# Patient Record
Sex: Female | Born: 1975 | Race: Black or African American | Hispanic: No | Marital: Married | Smoking: Current every day smoker
Health system: Southern US, Community
[De-identification: ages and names within clinical notes are randomized; demographics above are authoritative.]

## PROBLEM LIST (undated history)

## (undated) DIAGNOSIS — F419 Anxiety disorder, unspecified: Secondary | ICD-10-CM

## (undated) DIAGNOSIS — F410 Panic disorder [episodic paroxysmal anxiety] without agoraphobia: Secondary | ICD-10-CM

## (undated) DIAGNOSIS — A6009 Herpesviral infection of other urogenital tract: Secondary | ICD-10-CM

## (undated) DIAGNOSIS — K219 Gastro-esophageal reflux disease without esophagitis: Secondary | ICD-10-CM

## (undated) DIAGNOSIS — K589 Irritable bowel syndrome without diarrhea: Secondary | ICD-10-CM

## (undated) HISTORY — DX: Herpesviral infection of other urogenital tract: A60.09

## (undated) HISTORY — PX: COLONOSCOPY: SHX174

## (undated) HISTORY — DX: Gastro-esophageal reflux disease without esophagitis: K21.9

## (undated) HISTORY — DX: Irritable bowel syndrome, unspecified: K58.9

## (undated) HISTORY — PX: ADENOIDECTOMY: SUR15

---

## 1999-03-05 ENCOUNTER — Other Ambulatory Visit: Admission: RE | Admit: 1999-03-05 | Discharge: 1999-03-05 | Payer: Self-pay | Admitting: Family Medicine

## 2003-03-30 ENCOUNTER — Emergency Department (HOSPITAL_COMMUNITY): Admission: EM | Admit: 2003-03-30 | Discharge: 2003-03-30 | Payer: Self-pay | Admitting: Internal Medicine

## 2013-04-13 ENCOUNTER — Encounter (INDEPENDENT_AMBULATORY_CARE_PROVIDER_SITE_OTHER): Payer: Self-pay

## 2013-04-13 ENCOUNTER — Ambulatory Visit (INDEPENDENT_AMBULATORY_CARE_PROVIDER_SITE_OTHER): Payer: Medicaid Other | Admitting: *Deleted

## 2013-04-13 DIAGNOSIS — Z23 Encounter for immunization: Secondary | ICD-10-CM

## 2013-07-20 ENCOUNTER — Ambulatory Visit (INDEPENDENT_AMBULATORY_CARE_PROVIDER_SITE_OTHER): Payer: Medicaid Other | Admitting: Family Medicine

## 2013-07-20 VITALS — BP 170/101 | HR 89 | Temp 99.6°F | Wt 173.0 lb

## 2013-07-20 DIAGNOSIS — L0390 Cellulitis, unspecified: Secondary | ICD-10-CM

## 2013-07-20 DIAGNOSIS — L0291 Cutaneous abscess, unspecified: Secondary | ICD-10-CM

## 2013-07-20 MED ORDER — DOXYCYCLINE HYCLATE 100 MG PO TABS: 100.0000 mg | ORAL_TABLET | Freq: Two times a day (BID) | ORAL | Status: DC

## 2013-07-20 MED ORDER — HYDROCODONE-ACETAMINOPHEN 5-325 MG PO TABS: 1.0000 | ORAL_TABLET | Freq: Four times a day (QID) | ORAL | Status: DC | PRN

## 2013-07-20 MED ORDER — CEFTRIAXONE SODIUM 1 G IJ SOLR
1.0000 g | INTRAMUSCULAR | Status: AC
  Administered 2013-07-20: 1 g via INTRAMUSCULAR

## 2013-07-20 NOTE — Progress Notes (Signed)
   Subjective:    Patient ID: Emily Grant, female    DOB: [birth year], 38 y.o.   MRN: [id number]  HPI  This 38 y.o. female presents for evaluation of boil on right axilla.  She really wants to avoid an I&D Procedure because of the pain.  Review of Systems C/o boil right axilla   No chest pain, SOB, HA, dizziness, vision change, N/V, diarrhea, constipation, dysuria, urinary urgency or frequency, myalgias, arthralgias or rash.  Objective:   Physical Exam Vital signs noted  Well developed well nourished female.  HEENT - Head atraumatic Normocephalic  Respiratory - Lungs CTA bilateral Cardiac - RRR S1 and S2 without murmur GI - Abdomen soft Nontender and bowel sounds active x 4 Skin - Right axillary abscess with fluctuance and tenderness      Assessment & Plan:  Abscess - Plan: doxycycline (VIBRA-TABS) 100 MG tablet, cefTRIAXone (ROCEPHIN) injection 1 g Discussed using warm compresses and norco 5mg  one po qid prn pain #15, and then Follow up next week for I&D.  Deatra CanterWilliam J Raynell Upton FNP

## 2013-07-20 NOTE — Patient Instructions (Addendum)
Abscess An abscess is an infected area that contains a collection of pus and debris.It can occur in almost any part of the body. An abscess is also known as a furuncle or boil. CAUSES  An abscess occurs when tissue gets infected. This can occur from blockage of oil or sweat glands, infection of hair follicles, or a minor injury to the skin. As the body tries to fight the infection, pus collects in the area and creates pressure under the skin. This pressure causes pain. People with weakened immune systems have difficulty fighting infections and get certain abscesses more often.  SYMPTOMS Usually an abscess develops on the skin and becomes a painful mass that is red, warm, and tender. If the abscess forms under the skin, you may feel a moveable soft area under the skin. Some abscesses break open (rupture) on their own, but most will continue to get worse without care. The infection can spread deeper into the body and eventually into the bloodstream, causing you to feel ill.  DIAGNOSIS  Your caregiver will take your medical history and perform a physical exam. A sample of fluid may also be taken from the abscess to determine what is causing your infection. TREATMENT  Your caregiver may prescribe antibiotic medicines to fight the infection. However, taking antibiotics alone usually does not cure an abscess. Your caregiver may need to make a small cut (incision) in the abscess to drain the pus. In some cases, gauze is packed into the abscess to reduce pain and to continue draining the area. HOME CARE INSTRUCTIONS   Only take over-the-counter or prescription medicines for pain, discomfort, or fever as directed by your caregiver.  If you were prescribed antibiotics, take them as directed. Finish them even if you start to feel better.  If gauze is used, follow your caregiver's directions for changing the gauze.  To avoid spreading the infection:  Keep your draining abscess covered with a  bandage.  Wash your hands well.  Do not share personal care items, towels, or whirlpools with others.  Avoid skin contact with others.  Keep your skin and clothes clean around the abscess.  Keep all follow-up appointments as directed by your caregiver. SEEK MEDICAL CARE IF:   You have increased pain, swelling, redness, fluid drainage, or bleeding.  You have muscle aches, chills, or a general ill feeling.  You have a fever. MAKE SURE YOU:   Understand these instructions.  Will watch your condition.  Will get help right away if you are not doing well or get worse. Document Released: 03/17/2005 Document Revised: 12/07/2011 Document Reviewed: 08/20/2011 [facility] Patient Information 2014 [place], [place].   Ceftriaxone injection What is this medicine? CEFTRIAXONE (sef try AX one) is a cephalosporin antibiotic. It is used to treat certain kinds of bacterial infections. It will not work for colds, flu, or other viral infections. This medicine may be used for other purposes; ask your health care provider or pharmacist if you have questions. COMMON BRAND NAME(S): Rocephin What should I tell my health care provider before I take this medicine? They need to know if you have any of these conditions: -any chronic illness -bowel disease, like colitis -both kidney and liver disease -high bilirubin level in newborn patients -an unusual or allergic reaction to ceftriaxone, other cephalosporin or penicillin antibiotics, foods, dyes or preservatives -pregnant or trying to get pregnant -breast-feeding How should I use this medicine? This medicine is injected into a muscle or infused it into a vein. It is usually given  in a medical office or clinic. If you are to give this medicine you will be taught how to inject it. Follow instructions carefully. Use your doses at regular intervals. Do not take your medicine more often than directed. Do not skip doses or stop your medicine early even if you  feel better. Do not stop taking except on your doctor's advice. Talk to your pediatrician regarding the use of this medicine in children. Special care may be needed. Overdosage: If you think you have taken too much of this medicine contact a poison control center or emergency room at once. NOTE: This medicine is only for you. Do not share this medicine with others. What if I miss a dose? If you miss a dose, take it as soon as you can. If it is almost time for your next dose, take only that dose. Do not take double or extra doses. What may interact with this medicine? Do not take this medicine with any of the following medications: -intravenous calcium This list may not describe all possible interactions. Give your health care provider a list of all the medicines, herbs, non-prescription drugs, or dietary supplements you use. Also tell them if you smoke, drink alcohol, or use illegal drugs. Some items may interact with your medicine. What should I watch for while using this medicine? Tell your doctor or health care professional if your symptoms do not improve or if they get worse. Do not treat diarrhea with over the counter products. Contact your doctor if you have diarrhea that lasts more than 2 days or if it is severe and watery. If you are being treated for a sexually transmitted disease, avoid sexual contact until you have finished your treatment. Having sex can infect your sexual partner. Calcium may bind to this medicine and cause lung or kidney problems. Avoid calcium products while taking this medicine and for 48 hours after taking the last dose of this medicine. What side effects may I notice from receiving this medicine? Side effects that you should report to your doctor or health care professional as soon as possible: -allergic reactions like skin rash, itching or hives, swelling of the face, lips, or tongue -breathing problems -fever, chills -irregular heartbeat -pain when passing  urine -seizures -stomach pain, cramps -unusual bleeding, bruising -unusually weak or tired Side effects that usually do not require medical attention (report to your doctor or health care professional if they continue or are bothersome): -diarrhea -dizzy, drowsy -headache -nausea, vomiting -pain, swelling, irritation where injected -stomach upset -sweating This list may not describe all possible side effects. Call your doctor for medical advice about side effects. You may report side effects to FDA at 1-800-FDA-1088. Where should I keep my medicine? Keep out of the reach of children. Store at room temperature below 25 degrees C (77 degrees F). Protect from light. Throw away any unused vials after the expiration date. NOTE: This sheet is a summary. It may not cover all possible information. If you have questions about this medicine, talk to your doctor, pharmacist, or health care provider.  2014, Elsevier/[employer] Standard. (2012-06-19 15:34:57)

## 2013-12-04 ENCOUNTER — Encounter (INDEPENDENT_AMBULATORY_CARE_PROVIDER_SITE_OTHER): Payer: Medicaid Other

## 2014-03-18 ENCOUNTER — Telehealth: Payer: Self-pay | Admitting: Nurse Practitioner

## 2014-03-18 NOTE — Telephone Encounter (Signed)
appt scheduled

## 2014-03-26 ENCOUNTER — Ambulatory Visit (INDEPENDENT_AMBULATORY_CARE_PROVIDER_SITE_OTHER): Payer: Medicaid Other

## 2014-03-26 DIAGNOSIS — Z23 Encounter for immunization: Secondary | ICD-10-CM

## 2015-03-28 ENCOUNTER — Ambulatory Visit (INDEPENDENT_AMBULATORY_CARE_PROVIDER_SITE_OTHER): Payer: Medicaid Other | Admitting: Family Medicine

## 2015-03-28 ENCOUNTER — Ambulatory Visit (INDEPENDENT_AMBULATORY_CARE_PROVIDER_SITE_OTHER): Payer: Medicaid Other

## 2015-03-28 ENCOUNTER — Encounter: Payer: Self-pay | Admitting: Family Medicine

## 2015-03-28 VITALS — BP 125/75 | HR 91 | Temp 98.2°F | Ht 62.5 in | Wt 175.6 lb

## 2015-03-28 DIAGNOSIS — J029 Acute pharyngitis, unspecified: Secondary | ICD-10-CM | POA: Insufficient documentation

## 2015-03-28 DIAGNOSIS — H02829 Cysts of unspecified eye, unspecified eyelid: Secondary | ICD-10-CM | POA: Insufficient documentation

## 2015-03-28 DIAGNOSIS — H02823 Cysts of right eye, unspecified eyelid: Secondary | ICD-10-CM | POA: Diagnosis not present

## 2015-03-28 DIAGNOSIS — M25561 Pain in right knee: Secondary | ICD-10-CM

## 2015-03-28 LAB — POCT RAPID STREP A (OFFICE): Rapid Strep A Screen: NEGATIVE

## 2015-03-28 MED ORDER — AZITHROMYCIN 250 MG PO TABS: ORAL_TABLET | ORAL | Status: DC

## 2015-03-28 MED ORDER — NAPROXEN 500 MG PO TABS: 500.0000 mg | ORAL_TABLET | Freq: Two times a day (BID) | ORAL | Status: DC

## 2015-03-28 NOTE — Patient Instructions (Signed)
Nice to meet you!  For your knee- Try compression (knee sleeve), naprosyn twice daily for 5 days, and ice 2-3 times daily for 10-15 minutes each time  I am treating you for a lung infection with azithromycin  You will get a call about an eye doctor.

## 2015-03-28 NOTE — Progress Notes (Addendum)
HPI  Patient presents today for same-day visit for sore throat, ear pain, eyelid nodule, and knee pain.  Knee pain She states that she was dancing on September 23 with her husband when her right knee got tangled with his and she had a lateral deviation with medial pain of her right knee. She states that it's been slightly swollen since that time and difficult to straighten out. She states that hurts worse with walking. She has popping symptoms frequently.  Sore throat She's had sore throat, right ear pain, and congestion for 2 days. 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 There is no vision changes She has no fevers, chills, sweats.   PMH: Smoking status noted ROS: Per HPI  Objective: BP 125/75 mmHg  Pulse 91  Temp(Src) 98.2 F (36.8 C) (Oral)  Ht 5' 2.5" (1.588 m)  Wt 175 lb 9.6 oz (79.652 kg)  BMI 31.59 kg/m2 Gen: NAD, alert, cooperative with exam HEENT: NCAT, right eye with a small approximately 0.7 cm in diameter spherical mobile nodule that is nontender to palpation CV: RRR, good S1/S2, no murmur Resp: Nonlabored, coarse breath sounds with some scattered wheezing in left and right midlung fields Ext: No edema, warm Neuro: Alert and oriented, No gross deficits MSK: R knee without erythema, bruising, or gross deformity Some mild swelling of the right knee, patient has difficulty straightening it No joint line tenderness.  ligamentously intact to Lachman's and with varus and valgus stress.  Negative McMurray's test Walks with a limp   DG right knee: Medial compartment joint space narrowing, no bony abnormalities otherwise  Assessment and plan:  # Right knee pain Likely meniscal tear Offered injection and draining of fluid today, she declines  for now Conservative therapy with NSAIDs, compression, ice Plain film without any bony abnormality except for medial compartment arthritis   # Sore throat, acute illness Given her lung exam I am going to go ahead and treat for bronchitis, this could be due to her smoking, however she has coarse breath sounds in am concerned she has developing pneumonia. Azithromycin Strep negative  # Eyelid nodule Unclear etiology, will send to ophthalmology for her the recommendations No signs of inflammation or acute concern   Orders Placed This Encounter  Procedures  . Culture, Group A Strep  . DG Knee 1-2 Views Right    Standing Status: Future     Number of Occurrences:      Standing Expiration Date: 05/27/2016    Order Specific Question:  Reason for Exam (SYMPTOM  OR DIAGNOSIS REQUIRED)    Answer:  knee pain    Order Specific Question:  Is the patient pregnant?    Answer:  No    Order Specific Question:  Preferred imaging location?    Answer:  Internal  . Ambulatory referral to Ophthalmology    Referral Priority:  Routine    Referral Type:  Consultation    Referral Reason:  Specialty Services Required    Requested Specialty:  Ophthalmology    Number of Visits Requested:  1  . POCT rapid strep A    Meds ordered this encounter  Medications  . valACYclovir (VALTREX) 1000 MG tablet    Sig: Take 1,000 mg by mouth 2 (two) times daily.  . naproxen (NAPROSYN) 500 MG  tablet    Sig: Take 1 tablet (500 mg total) by mouth 2 (two) times daily with a meal.    Dispense:  30 tablet    Refill:  1  . azithromycin (ZITHROMAX) 250 MG tablet    Sig: Take 2 tablets on day 1 and 1 tablet daily after that    Dispense:  6 tablet    Refill:  0    Murtis Sink, MD [facility] 03/28/2015, 12:24 PM

## 2015-04-01 LAB — CULTURE, GROUP A STREP

## 2015-04-02 ENCOUNTER — Telehealth: Payer: Self-pay | Admitting: Family Medicine

## 2015-04-02 NOTE — Telephone Encounter (Signed)
Stp and reviewed results. Pt voiced understanding.

## 2015-04-30 ENCOUNTER — Ambulatory Visit (INDEPENDENT_AMBULATORY_CARE_PROVIDER_SITE_OTHER): Payer: Medicaid Other | Admitting: Family Medicine

## 2015-04-30 ENCOUNTER — Encounter: Payer: Self-pay | Admitting: Family Medicine

## 2015-04-30 VITALS — BP 125/78 | HR 93 | Temp 97.3°F | Ht 62.5 in | Wt 178.0 lb

## 2015-04-30 DIAGNOSIS — M25561 Pain in right knee: Secondary | ICD-10-CM

## 2015-04-30 DIAGNOSIS — Z23 Encounter for immunization: Secondary | ICD-10-CM

## 2015-04-30 NOTE — Patient Instructions (Signed)
Great to see you!  We will work on the appointment  Continue wearing the knee sleeve, especially while you are doing lots of walking or exercise Ice afterward Naproxen twice daily or advil 3-4 times daily as needed

## 2015-04-30 NOTE — Progress Notes (Signed)
   HPI  Patient presents today for recheck of right knee pain.  Patient explains that she has continued right knee pain. Improved since her last visit but has continued on the medial part of the right knee. It's worse at the end of the day after loss of walking or exercise. She has mild swelling intermittently. She is very anxious about the possibility of a knee injection.  She reports a limp but states the pain only bothers her 3-4 days a week  PMH: Smoking status noted ROS: Per HPI  Objective: BP 125/78 mmHg  Pulse 93  Temp(Src) 97.3 F (36.3 C) (Oral)  Ht 5' 2.5" (1.588 m)  Wt 178 lb (80.74 kg)  BMI 32.02 kg/m2 Gen: NAD, alert, cooperative with exam HEENT: NCAT, Healing scar above R eye lid Ext: No edema, warm Neuro: Alert and oriented, No gross deficits MSK: R knee with mild effusion, no erythema, bruising, or gross deformity Medial joint line tenderness ligamentously intact to Lachman's and with varus and valgus stress.  McMurray's test with medial jont line tenderness, also with valgus stress   Assessment and plan:  # R knee pain, likely meniscal injury Offered injectiuon, she is too nervous Recommended continue compression, ice nsaids Refer to Ortho for consideration of injection and possible R knee arthroscopy   R eyelid cyst removed by optho, doing well.   Orders Placed This Encounter  Procedures  . Ambulatory referral to Orthopedic Surgery    Referral Priority:  Routine    Referral Type:  Surgical    Referral Reason:  Specialty Services Required    Requested Specialty:  Orthopedic Surgery    Number of Visits Requested:  1    No orders of the defined types were placed in this encounter.    Murtis SinkSam Moosa Bueche, MD [facility] 04/30/2015, 11:29 AM

## 2016-01-09 ENCOUNTER — Ambulatory Visit (INDEPENDENT_AMBULATORY_CARE_PROVIDER_SITE_OTHER): Payer: Medicaid Other | Admitting: Nurse Practitioner

## 2016-01-09 ENCOUNTER — Telehealth: Payer: Self-pay | Admitting: Family Medicine

## 2016-01-09 ENCOUNTER — Encounter: Payer: Self-pay | Admitting: Nurse Practitioner

## 2016-01-09 VITALS — BP 129/84 | HR 99 | Temp 97.6°F | Ht 62.0 in | Wt 169.0 lb

## 2016-01-09 DIAGNOSIS — B009 Herpesviral infection, unspecified: Secondary | ICD-10-CM

## 2016-01-09 DIAGNOSIS — Z111 Encounter for screening for respiratory tuberculosis: Secondary | ICD-10-CM | POA: Diagnosis not present

## 2016-01-09 MED ORDER — VALACYCLOVIR HCL 1 G PO TABS: 1000.0000 mg | ORAL_TABLET | Freq: Two times a day (BID) | ORAL | Status: DC

## 2016-01-09 NOTE — Progress Notes (Signed)
   Subjective:    Patient ID: Emily Grant, female    DOB: 06-20-1976, 40 y.o.   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 Currently does not have an outbreak.    Review of Systems  Constitutional: Negative.   HENT: Negative.   Respiratory: Negative.   Cardiovascular: Negative.   Genitourinary: Negative.   Neurological: Negative.   Psychiatric/Behavioral: Negative.   All other systems reviewed and are negative.      Objective:   Physical Exam  Constitutional: She is oriented to person, place, and time. She appears well-developed and well-nourished.  Cardiovascular: Normal rate, regular rhythm and normal heart sounds.   Pulmonary/Chest: Effort normal and breath sounds normal.  Neurological: She is alert and oriented to person, place, and time.  Skin: Skin is warm.  Psychiatric: She has a normal mood and affect. Her behavior is normal. Judgment and thought content normal.   BP 129/84 mmHg  Pulse 99  Temp(Src) 97.6 F (36.4 C) (Oral)  Ht 5\' 2"  (1.575 m)  Wt 169 lb (76.658 kg)  BMI 30.90 kg/m2        Assessment & Plan:   1. Encounter for PPD test   2. HSV-2 infection    Meds ordered this encounter  Medications  . valACYclovir (VALTREX) 1000 MG tablet    Sig: Take 1 tablet (1,000 mg total) by mouth 2 (two) times daily.    Dispense:  30 tablet    Refill:  1    Order Specific Question:  Supervising Provider    Answer:  Johna SheriffVINCENT, CAROL L [[id number]]   Safe sex reviewed Follow up prn  Mary-Margaret Daphine DeutscherMartin, FNP

## 2016-01-09 NOTE — Telephone Encounter (Signed)
Pt given appt with MMM today at 4:30. 

## 2016-01-12 LAB — TB SKIN TEST
INDURATION: 0 mm
TB Skin Test: NEGATIVE

## 2016-01-27 ENCOUNTER — Telehealth: Payer: Self-pay | Admitting: Nurse Practitioner

## 2016-01-27 DIAGNOSIS — K5900 Constipation, unspecified: Secondary | ICD-10-CM

## 2016-01-27 MED ORDER — LUBIPROSTONE 8 MCG PO CAPS: 8.0000 ug | ORAL_CAPSULE | Freq: Two times a day (BID) | ORAL | 2 refills | Status: DC

## 2016-01-27 NOTE — Telephone Encounter (Signed)
Pt notified RX was sent into the pharmacy

## 2016-01-27 NOTE — Telephone Encounter (Signed)
Constipation is worse Miralax is not helping Pt wants rx Please advise

## 2016-01-27 NOTE — Telephone Encounter (Signed)
Will send in amitiza rx

## 2016-02-03 ENCOUNTER — Encounter: Payer: Self-pay | Admitting: Family Medicine

## 2016-02-03 ENCOUNTER — Other Ambulatory Visit: Payer: Self-pay | Admitting: Family Medicine

## 2016-02-03 ENCOUNTER — Ambulatory Visit (INDEPENDENT_AMBULATORY_CARE_PROVIDER_SITE_OTHER): Payer: Medicaid Other

## 2016-02-03 ENCOUNTER — Ambulatory Visit: Payer: Medicaid Other | Admitting: Family Medicine

## 2016-02-03 VITALS — BP 127/72 | HR 79 | Temp 98.1°F | Ht 62.0 in | Wt 168.2 lb

## 2016-02-03 DIAGNOSIS — M546 Pain in thoracic spine: Secondary | ICD-10-CM

## 2016-02-03 DIAGNOSIS — M542 Cervicalgia: Secondary | ICD-10-CM | POA: Diagnosis not present

## 2016-02-03 MED ORDER — METHOCARBAMOL 500 MG PO TABS: 500.0000 mg | ORAL_TABLET | Freq: Four times a day (QID) | ORAL | 1 refills | Status: DC

## 2016-02-03 MED ORDER — PREDNISONE 50 MG PO TABS: 50.0000 mg | ORAL_TABLET | Freq: Every day | ORAL | 0 refills | Status: DC

## 2016-02-03 NOTE — Progress Notes (Signed)
   HPI  Patient presents today here in 5 days of acute thoracic back pain.  Patient's lines that she just woke up one morning and had bilateral midline thoracic back pain between her shoulder blades. She states that over the last 2 days as started to bother her neck as well. She has no arm weakness or numbness 13 living. She has difficulty lifting her arms over midline due to pain.  She states Flexeril is not helping, also a muscle relaxer from a friend is not helping. She is unaware of what medication she tried from her friend. She does smoke marijuana but denies any history of injected drugs.  As fever, chills, sweats, or feeling ill.  PMH: Smoking status noted ROS: Per HPI  Objective: BP 127/72   Pulse 79   Temp 98.1 F (36.7 C) (Oral)   Ht 5\' 2"  (1.575 m)   Wt 168 lb 4 oz (76.3 kg)   LMP 01/16/2016 (Exact Date)   BMI 30.77 kg/m  Gen: NAD, alert, cooperative with exam HEENT: NCAT, EOMI CV: RRR, good S1/S2, no murmur Resp: CTABL, no wheezes, non-labored Ext: No edema, warm Neuro: Alert and oriented, No gross deficits  Skill skeletal Tenderness to palpation of thoracic and cervical spine, as well as bilateral paraspinal muscles in the thoracic and cervical area. Range of motion of neck is very limited  Assessment and plan:  # Neck and thoracic back pain. Likely muscle spasm. No signs or symptoms of disc infection or serious disc disease. Plain films today are normal. Maximize anti-inflammatory using prednisone 7 days, change muscle relaxer to Robaxin. 2 days out of work Return to clinic if not improved or if she is getting worse.      Orders Placed This Encounter  Procedures  . DG Thoracic Spine 2 View    Standing Status:   Future    Standing Expiration Date:   04/04/2017    Order Specific Question:   Reason for Exam (SYMPTOM  OR DIAGNOSIS REQUIRED)    Answer:   neck and back pain    Order Specific Question:   Is the patient pregnant?    Answer:   No   Order Specific Question:   Preferred imaging location?    Answer:   Internal  . DG Cervical Spine 2 or 3 views    Standing Status:   Future    Standing Expiration Date:   02/02/2017    Order Specific Question:   Reason for Exam (SYMPTOM  OR DIAGNOSIS REQUIRED)    Answer:   neck and back pain    Order Specific Question:   Is the patient pregnant?    Answer:   No    Order Specific Question:   Preferred imaging location?    Answer:   Internal    Meds ordered this encounter  Medications  . predniSONE (DELTASONE) 50 MG tablet    Sig: Take 1 tablet (50 mg total) by mouth daily with breakfast.    Dispense:  7 tablet    Refill:  0  . methocarbamol (ROBAXIN) 500 MG tablet    Sig: Take 1 tablet (500 mg total) by mouth 4 (four) times daily.    Dispense:  30 tablet    Refill:  1    Murtis SinkSam Jahad Old, MD [facility] 02/03/2016, 11:27 AM

## 2016-02-03 NOTE — Patient Instructions (Signed)
Great to meet you!  Try the prednisone 1 pill once a day, also try robaxin.    Back Pain, Adult Back pain is very common in adults.The cause of back pain is rarely dangerous and the pain often gets better over time.The cause of your back pain may not be known. Some common causes of back pain include:  Strain of the muscles or ligaments supporting the spine.  Wear and tear (degeneration) of the spinal disks.  Arthritis.  Direct injury to the back. For many people, back pain may return. Since back pain is rarely dangerous, most people can learn to manage this condition on their own. HOME CARE INSTRUCTIONS Watch your back pain for any changes. The following actions may help to lessen any discomfort you are feeling:  Remain active. It is stressful on your back to sit or stand in one place for long periods of time. Do not sit, drive, or stand in one place for more than 30 minutes at a time. Take short walks on even surfaces as soon as you are able.Try to increase the length of time you walk each day.  Exercise regularly as directed by your health care provider. Exercise helps your back heal faster. It also helps avoid future injury by keeping your muscles strong and flexible.  Do not stay in bed.Resting more than 1-2 days can delay your recovery.  Pay attention to your body when you bend and lift. The most comfortable positions are those that put less stress on your recovering back. Always use proper lifting techniques, including:  Bending your knees.  Keeping the load close to your body.  Avoiding twisting.  Find a comfortable position to sleep. Use a firm mattress and lie on your side with your knees slightly bent. If you lie on your back, put a pillow under your knees.  Avoid feeling anxious or stressed.Stress increases muscle tension and can worsen back pain.It is important to recognize when you are anxious or stressed and learn ways to manage it, such as with  exercise.  Take medicines only as directed by your health care provider. Over-the-counter medicines to reduce pain and inflammation are often the most helpful.Your health care provider may prescribe muscle relaxant drugs.These medicines help dull your pain so you can more quickly return to your normal activities and healthy exercise.  Apply ice to the injured area:  Put ice in a plastic bag.  Place a towel between your skin and the bag.  Leave the ice on for 20 minutes, 2-3 times a day for the first 2-3 days. After that, ice and heat may be alternated to reduce pain and spasms.  Maintain a healthy weight. Excess weight puts extra stress on your back and makes it difficult to maintain good posture. SEEK MEDICAL CARE IF:  You have pain that is not relieved with rest or medicine.  You have increasing pain going down into the legs or buttocks.  You have pain that does not improve in one week.  You have night pain.  You lose weight.  You have a fever or chills. SEEK IMMEDIATE MEDICAL CARE IF:   You develop new bowel or bladder control problems.  You have unusual weakness or numbness in your arms or legs.  You develop nausea or vomiting.  You develop abdominal pain.  You feel faint.   This information is not intended to replace advice given to you by your health care provider. Make sure you discuss any questions you have with your health  care provider.   Document Released: 06/07/2005 Document Revised: 06/28/2014 Document Reviewed: 10/09/2013 Elsevier Interactive Patient Education [employer]2016 [employer]

## 2016-04-03 ENCOUNTER — Ambulatory Visit (INDEPENDENT_AMBULATORY_CARE_PROVIDER_SITE_OTHER): Payer: Medicaid Other

## 2016-04-03 DIAGNOSIS — Z23 Encounter for immunization: Secondary | ICD-10-CM

## 2016-10-05 ENCOUNTER — Encounter (INDEPENDENT_AMBULATORY_CARE_PROVIDER_SITE_OTHER): Payer: Self-pay

## 2016-10-05 ENCOUNTER — Ambulatory Visit (INDEPENDENT_AMBULATORY_CARE_PROVIDER_SITE_OTHER): Payer: Medicaid Other | Admitting: Nurse Practitioner

## 2016-10-05 ENCOUNTER — Encounter: Payer: Self-pay | Admitting: Nurse Practitioner

## 2016-10-05 VITALS — BP 131/78 | HR 75 | Temp 97.7°F | Ht 62.0 in | Wt 168.0 lb

## 2016-10-05 DIAGNOSIS — F411 Generalized anxiety disorder: Secondary | ICD-10-CM

## 2016-10-05 DIAGNOSIS — B009 Herpesviral infection, unspecified: Secondary | ICD-10-CM | POA: Diagnosis not present

## 2016-10-05 MED ORDER — CITALOPRAM HYDROBROMIDE 20 MG PO TABS: 20.0000 mg | ORAL_TABLET | Freq: Every day | ORAL | 3 refills | Status: DC

## 2016-10-05 MED ORDER — ALPRAZOLAM 0.25 MG PO TABS: 0.2500 mg | ORAL_TABLET | Freq: Every evening | ORAL | 0 refills | Status: DC | PRN

## 2016-10-05 MED ORDER — VALACYCLOVIR HCL 1 G PO TABS: 1000.0000 mg | ORAL_TABLET | Freq: Two times a day (BID) | ORAL | 5 refills | Status: DC

## 2016-10-05 NOTE — Progress Notes (Signed)
   Subjective:    Patient ID: Emily Grant, female    DOB: Mar 09, 1976, 41 y.o.   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 SHe is also very anxious- having trouble with her son and makes her anxious all the time. Has been on xanax for several years but has currrently ran out.   Review of Systems  Constitutional: Negative.   HENT: Negative.   Respiratory: Negative.   Cardiovascular: Negative.   Gastrointestinal: Negative.   Genitourinary: Negative.   Neurological: Negative.   Psychiatric/Behavioral: Positive for sleep disturbance. Negative for suicidal ideas. The patient is nervous/anxious.   All other systems reviewed and are negative.      Objective:   Physical Exam  Constitutional: She is oriented to person, place, and time. She appears well-developed and well-nourished. No distress.  Cardiovascular: Normal rate and regular rhythm.   Pulmonary/Chest: Effort normal and breath sounds normal.  Neurological: She is alert and oriented to person, place, and time.  Skin: Skin is warm.  Psychiatric: She has a normal mood and affect. Her behavior is normal. Judgment and thought content normal.   BP 131/78   Pulse 75   Temp 97.7 F (36.5 C) (Oral)   Ht  (1.575 m)   Wt 168 lb (76.2 kg)   BMI 30.73 kg/m       Assessment & Plan:  1. HSV-2 infection Safe sex - valACYclovir (VALTREX) 1000 MG tablet; Take 1 tablet (1,000 mg total) by mouth 2 (two) times daily.  Dispense: 30 tablet; Refill: 5  2. GAD (generalized anxiety disorder) Stress management - citalopram (CELEXA) 20 MG tablet; Take 1 tablet (20 mg total) by mouth daily.  Dispense: 30 tablet; Refill: 3 - ALPRAZolam (XANAX) 0.25 MG tablet; Take 1 tablet (0.25 mg total) by mouth at bedtime as needed for anxiety.  Dispense: 30 tablet; Refill: 0  Mary-Margaret Daphine Deutscher, FNP

## 2016-10-05 NOTE — Patient Instructions (Signed)
Stress and Stress Management Stress is a normal reaction to life events. It is what you feel when life demands more than you are used to or more than you can handle. Some stress can be useful. For example, the stress reaction can help you catch the last bus of the day, study for a test, or meet a deadline at work. But stress that occurs too often or for too long can cause problems. It can affect your emotional health and interfere with relationships and normal daily activities. Too much stress can weaken your immune system and increase your risk for physical illness. If you already have a medical problem, stress can make it worse. What are the causes? All sorts of life events may cause stress. An event that causes stress for one person may not be stressful for another person. Major life events commonly cause stress. These may be positive or negative. Examples include losing your job, moving into a new home, getting married, having a baby, or losing a loved one. Less obvious life events may also cause stress, especially if they occur day after day or in combination. Examples include working long hours, driving in traffic, caring for children, being in debt, or being in a difficult relationship. What are the signs or symptoms? Stress may cause emotional symptoms including, the following:  Anxiety. This is feeling worried, afraid, on edge, overwhelmed, or out of control.  Anger. This is feeling irritated or impatient.  Depression. This is feeling sad, down, helpless, or guilty.  Difficulty focusing, remembering, or making decisions. Stress may cause physical symptoms, including the following:  Aches and pains. These may affect your head, neck, back, stomach, or other areas of your body.  Tight muscles or clenched jaw.  Low energy or trouble sleeping. Stress may cause unhealthy behaviors, including the following:  Eating to feel better (overeating) or skipping meals.  Sleeping too little, too  much, or both.  Working too much or putting off tasks (procrastination).  Smoking, drinking alcohol, or using drugs to feel better. How is this diagnosed? Stress is diagnosed through an assessment by your health care provider. Your health care provider will ask questions about your symptoms and any stressful life events.Your health care provider will also ask about your medical history and may order blood tests or other tests. Certain medical conditions and medicine can cause physical symptoms similar to stress. Mental illness can cause emotional symptoms and unhealthy behaviors similar to stress. Your health care provider may refer you to a mental health professional for further evaluation. How is this treated? Stress management is the recommended treatment for stress.The goals of stress management are reducing stressful life events and coping with stress in healthy ways. Techniques for reducing stressful life events include the following:  Stress identification. Self-monitor for stress and identify what causes stress for you. These skills may help you to avoid some stressful events.  Time management. Set your priorities, keep a calendar of events, and learn to say "no." These tools can help you avoid making too many commitments. Techniques for coping with stress include the following:  Rethinking the problem. Try to think realistically about stressful events rather than ignoring them or overreacting. Try to find the positives in a stressful situation rather than focusing on the negatives.  Exercise. Physical exercise can release both physical and emotional tension. The key is to find a form of exercise you enjoy and do it regularly.  Relaxation techniques. These relax the body and mind. Examples include yoga,  meditation, tai chi, biofeedback, deep breathing, progressive muscle relaxation, listening to music, being out in nature, journaling, and other hobbies. Again, the key is to find one or  more that you enjoy and can do regularly.  Healthy lifestyle. Eat a balanced diet, get plenty of sleep, and do not smoke. Avoid using alcohol or drugs to relax.  Strong support network. Spend time with family, friends, or other people you enjoy being around.Express your feelings and talk things over with someone you trust. Counseling or talktherapy with a mental health professional may be helpful if you are having difficulty managing stress on your own. Medicine is typically not recommended for the treatment of stress.Talk to your health care provider if you think you need medicine for symptoms of stress. Follow these instructions at home:  Keep all follow-up visits as directed by your health care provider.  Take all medicines as directed by your health care provider. Contact a health care provider if:  Your symptoms get worse or you start having new symptoms.  You feel overwhelmed by your problems and can no longer manage them on your own. Get help right away if:  You feel like hurting yourself or someone else. This information is not intended to replace advice given to you by your health care provider. Make sure you discuss any questions you have with your health care provider. Document Released: 12/01/2000 Document Revised: 11/13/2015 Document Reviewed: 01/30/2013 Elsevier Interactive Patient Education  2017 [employer].

## 2016-10-19 ENCOUNTER — Telehealth: Payer: Self-pay | Admitting: Nurse Practitioner

## 2016-10-19 NOTE — Telephone Encounter (Signed)
No the celexais for anxiety- needs to try melatonin OTC with it before doing rx meds

## 2016-10-20 NOTE — Telephone Encounter (Signed)
Patient notified and is agreeable to trying melatonin OTC for sleep.

## 2017-04-28 ENCOUNTER — Ambulatory Visit (INDEPENDENT_AMBULATORY_CARE_PROVIDER_SITE_OTHER): Payer: Medicaid Other

## 2017-04-28 ENCOUNTER — Other Ambulatory Visit: Payer: Self-pay | Admitting: Family Medicine

## 2017-04-28 DIAGNOSIS — Z23 Encounter for immunization: Secondary | ICD-10-CM

## 2017-04-28 NOTE — Telephone Encounter (Signed)
Spoke with pt and she is having a hard time because her son is in the hospital in [place] and she doesn't have transportation to get there so she is stressed out and not sleeping. She wanted rx for xanax to help get her through. Advised pt she would ntbs to evaluate anxiety and treatment options. Pt offered appt in the morning but pt declined due to transportation and accepted appt 11/12 at 10:55 with Dr Ermalinda MemosBradshaw.

## 2017-04-28 NOTE — Telephone Encounter (Signed)
What is the name of the medication? Xanax  Have you contacted your pharmacy to request a refill? NO  Which [facility]   Patient notified that their request is being sent to the clinical staff for review and that they should receive a call once it is complete. If they do not receive a call within 24 hours they can check with their pharmacy or our office.

## 2017-05-02 ENCOUNTER — Ambulatory Visit: Payer: Medicaid Other | Admitting: Family Medicine

## 2017-05-05 ENCOUNTER — Encounter: Payer: Self-pay | Admitting: Family Medicine

## 2017-05-05 ENCOUNTER — Ambulatory Visit (INDEPENDENT_AMBULATORY_CARE_PROVIDER_SITE_OTHER): Payer: Medicaid Other | Admitting: Family Medicine

## 2017-05-05 DIAGNOSIS — F329 Major depressive disorder, single episode, unspecified: Secondary | ICD-10-CM | POA: Diagnosis not present

## 2017-05-05 DIAGNOSIS — F32A Depression, unspecified: Secondary | ICD-10-CM

## 2017-05-05 DIAGNOSIS — F419 Anxiety disorder, unspecified: Secondary | ICD-10-CM | POA: Diagnosis not present

## 2017-05-05 DIAGNOSIS — B009 Herpesviral infection, unspecified: Secondary | ICD-10-CM

## 2017-05-05 MED ORDER — LORAZEPAM 0.5 MG PO TABS: 0.5000 mg | ORAL_TABLET | Freq: Two times a day (BID) | ORAL | 0 refills | Status: DC | PRN

## 2017-05-05 MED ORDER — VALACYCLOVIR HCL 1 G PO TABS: 1000.0000 mg | ORAL_TABLET | Freq: Every day | ORAL | 3 refills | Status: DC

## 2017-05-05 MED ORDER — MIRTAZAPINE 15 MG PO TABS: 15.0000 mg | ORAL_TABLET | Freq: Every day | ORAL | 3 refills | Status: DC

## 2017-05-05 NOTE — Progress Notes (Signed)
   HPI  Patient presents today here for anxiety as well as HSV-2.  HSV-2 Chronic issue, needs refill of Valtrex, doing well.  Anxiety and depression. She has had issues for years, however over the last few months has been worse.  Her son has been sick with sickle cell that he disease and CMV. She states that she frequently wakes up in the middle of night thinking that someone is going to die.  She has difficult time sleeping.  She has difficulty concentrating. She denies suicidal thoughts.  She previously tried Xanax with good improvement.  She requests refill of this. She previously tried Prozac which she reports caused a tremor and Celexa which she reports made her "not feel like herself".   PMH: Smoking status noted ROS: Per HPI  Objective: BP (!) 147/89   Pulse 83   Temp 98 F (36.7 C) (Oral)   Ht 5\' 2"  (1.575 m)   Wt 172 lb 9.6 oz (78.3 kg)   BMI 31.57 kg/m  Gen: NAD, alert, cooperative with exam HEENT: NCAT CV: RRR, good S1/S2, no murmur Resp: CTABL, no wheezes, non-labored Ext: No edema, warm Neuro: Alert and oriented, No gross deficits  Depression screen [facility] 2/9 05/05/2017 02/03/2016 03/28/2015  Decreased Interest 3 0 0  Down, Depressed, Hopeless 0 0 0  PHQ - 2 Score 3 0 0  Altered sleeping 3 - -  Tired, decreased energy 1 - -  Change in appetite 0 - -  Feeling bad or failure about yourself  0 - -  Trouble concentrating 3 - -  Moving slowly or fidgety/restless 3 - -  Suicidal thoughts 0 - -  PHQ-9 Score 13 - -   GAD 7 : Generalized Anxiety Score 05/05/2017 10/05/2016  Nervous, Anxious, on Edge 3 3  Control/stop worrying 3 3  Worry too much - different things 3 3  Trouble relaxing 3 2  Restless 3 2  Easily annoyed or irritable 3 3  Afraid - awful might happen 3 3  Total GAD 7 Score 21 19  Anxiety Difficulty - Very difficult     Assessment and plan:  #Anxiety and depression. Starting Remeron with difficulty sleeping and mixed anxiety and depression.   Also given Ativan. Patient mitts to smoking marijuana but agrees to quit, we discussed a drug test in 3-4 months. Ativan as needed. Follow-up 3-4 weeks  HSV-2 infection Refill Valtrex, clinically stable  Meds ordered this encounter  Medications  . valACYclovir (VALTREX) 1000 MG tablet    Sig: Take 1 tablet (1,000 mg total) daily by mouth.    Dispense:  90 tablet    Refill:  3  . mirtazapine (REMERON) 15 MG tablet    Sig: Take 1 tablet (15 mg total) at bedtime by mouth.    Dispense:  30 tablet    Refill:  3  . LORazepam (ATIVAN) 0.5 MG tablet    Sig: Take 1 tablet (0.5 mg total) 2 (two) times daily as needed by mouth for anxiety.    Dispense:  30 tablet    Refill:  0    Murtis SinkSam Leiland Mihelich, MD [facility] 05/05/2017, 8:47 AM

## 2017-05-05 NOTE — Patient Instructions (Signed)
Great to see you!  Start remeron 1 pill once daily for anxiety, depression, and sleep. This is a controller medications.   Start ativan 1 pill up to 2 times daily as needed for anxiety. This is an as needed medication.   Come back in 3-4 weeks

## 2017-05-11 IMAGING — DX DG CERVICAL SPINE COMPLETE 4+V
4 series · 4 of 4 positions shown · non-contrast
Comparison: No acute abnormality.

CLINICAL DATA: Neck pain.  No known injury.  Initial evaluation.

EXAM:
CERVICAL SPINE - COMPLETE 4+ VIEW

[c-spine lat]
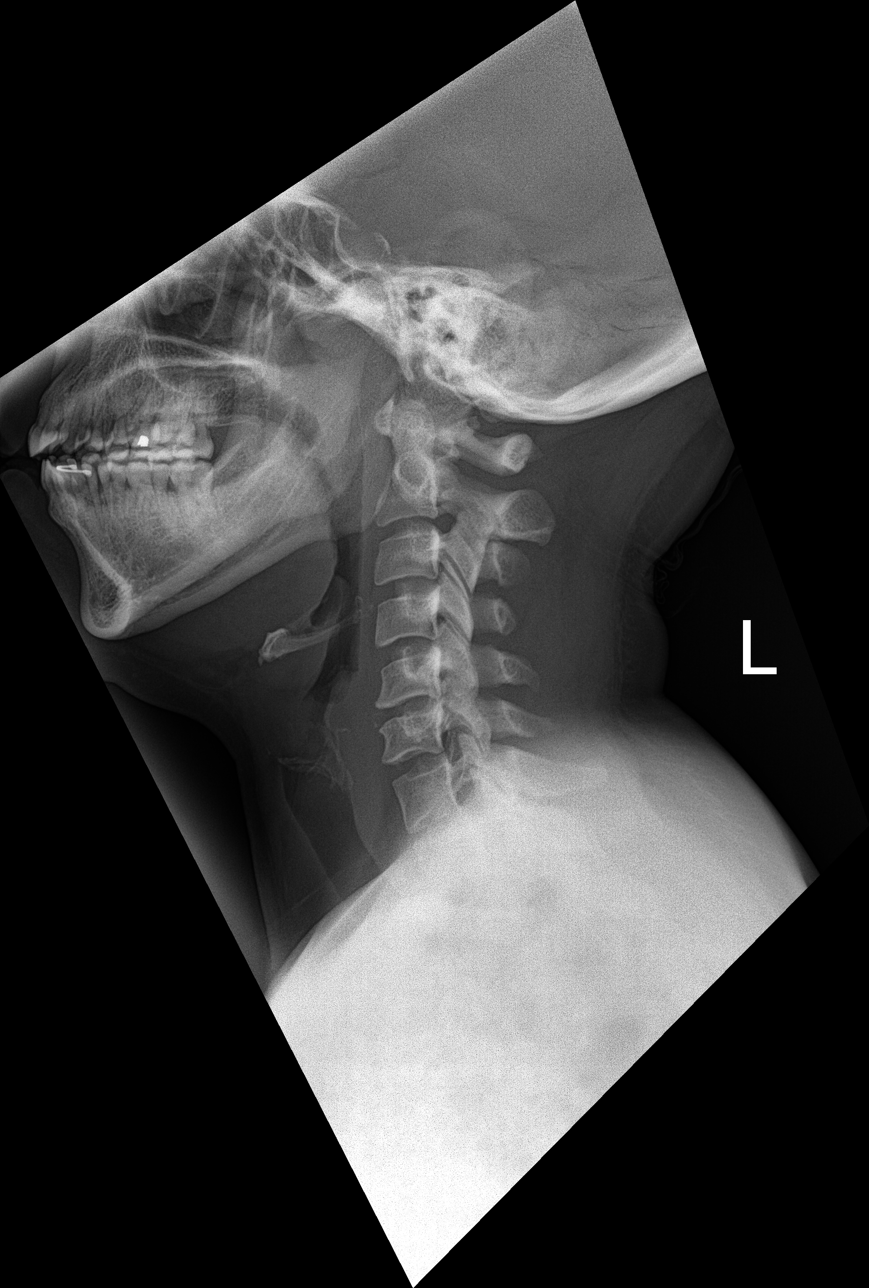

[c-spine obl (1 of 2)]
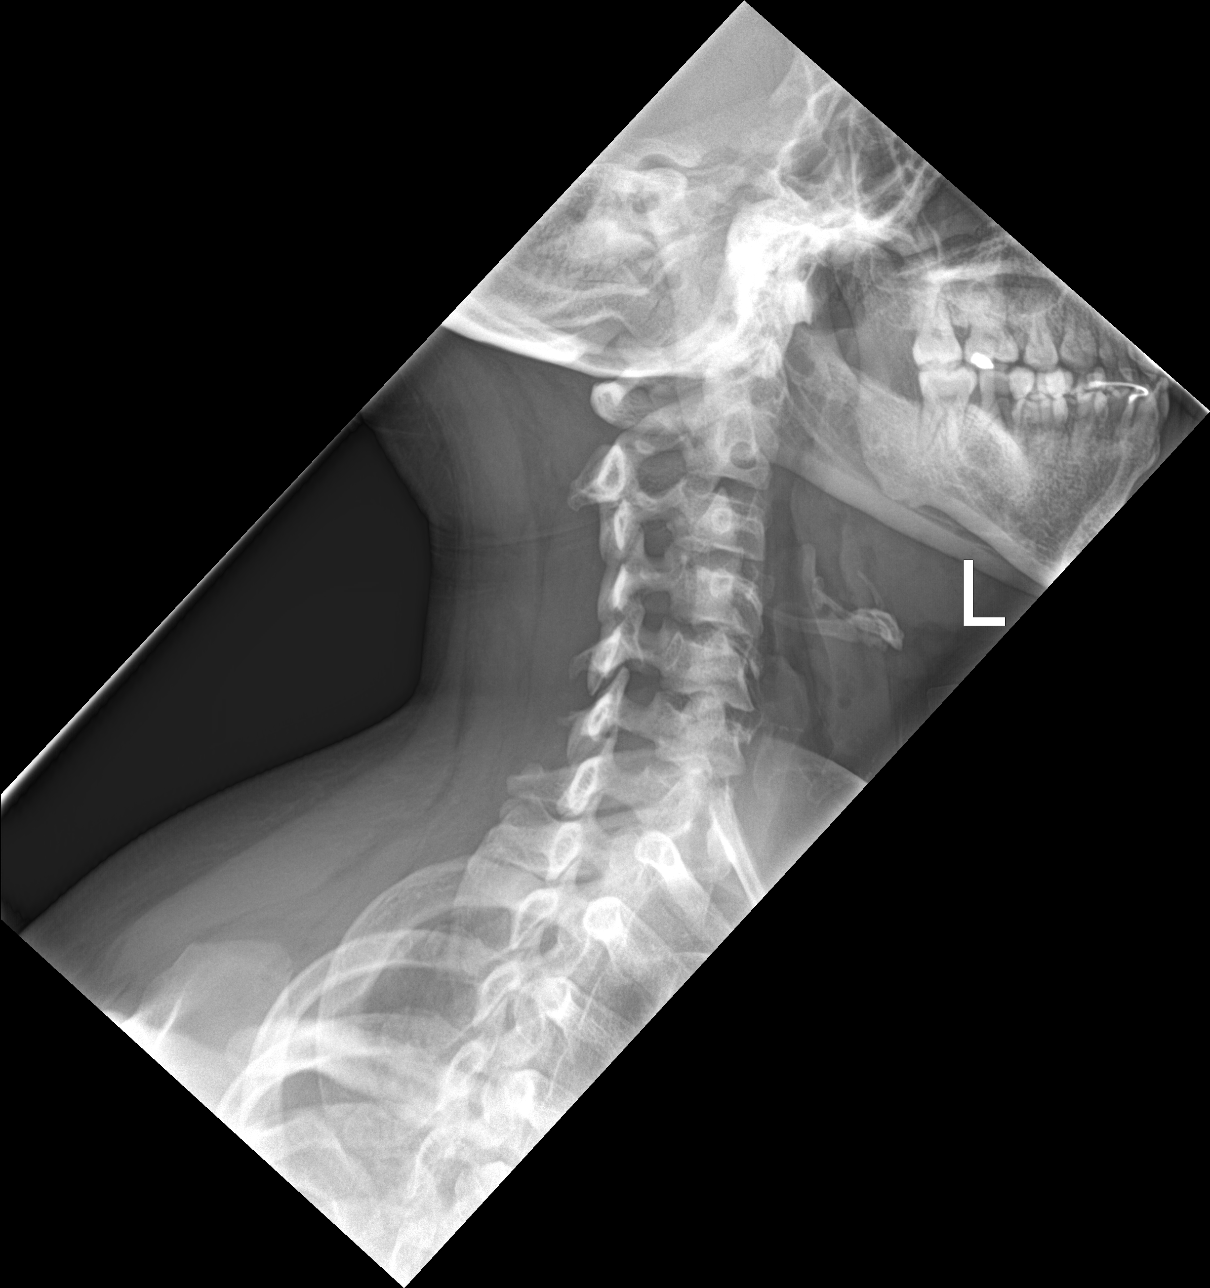

[c-spine obl (2 of 2)]
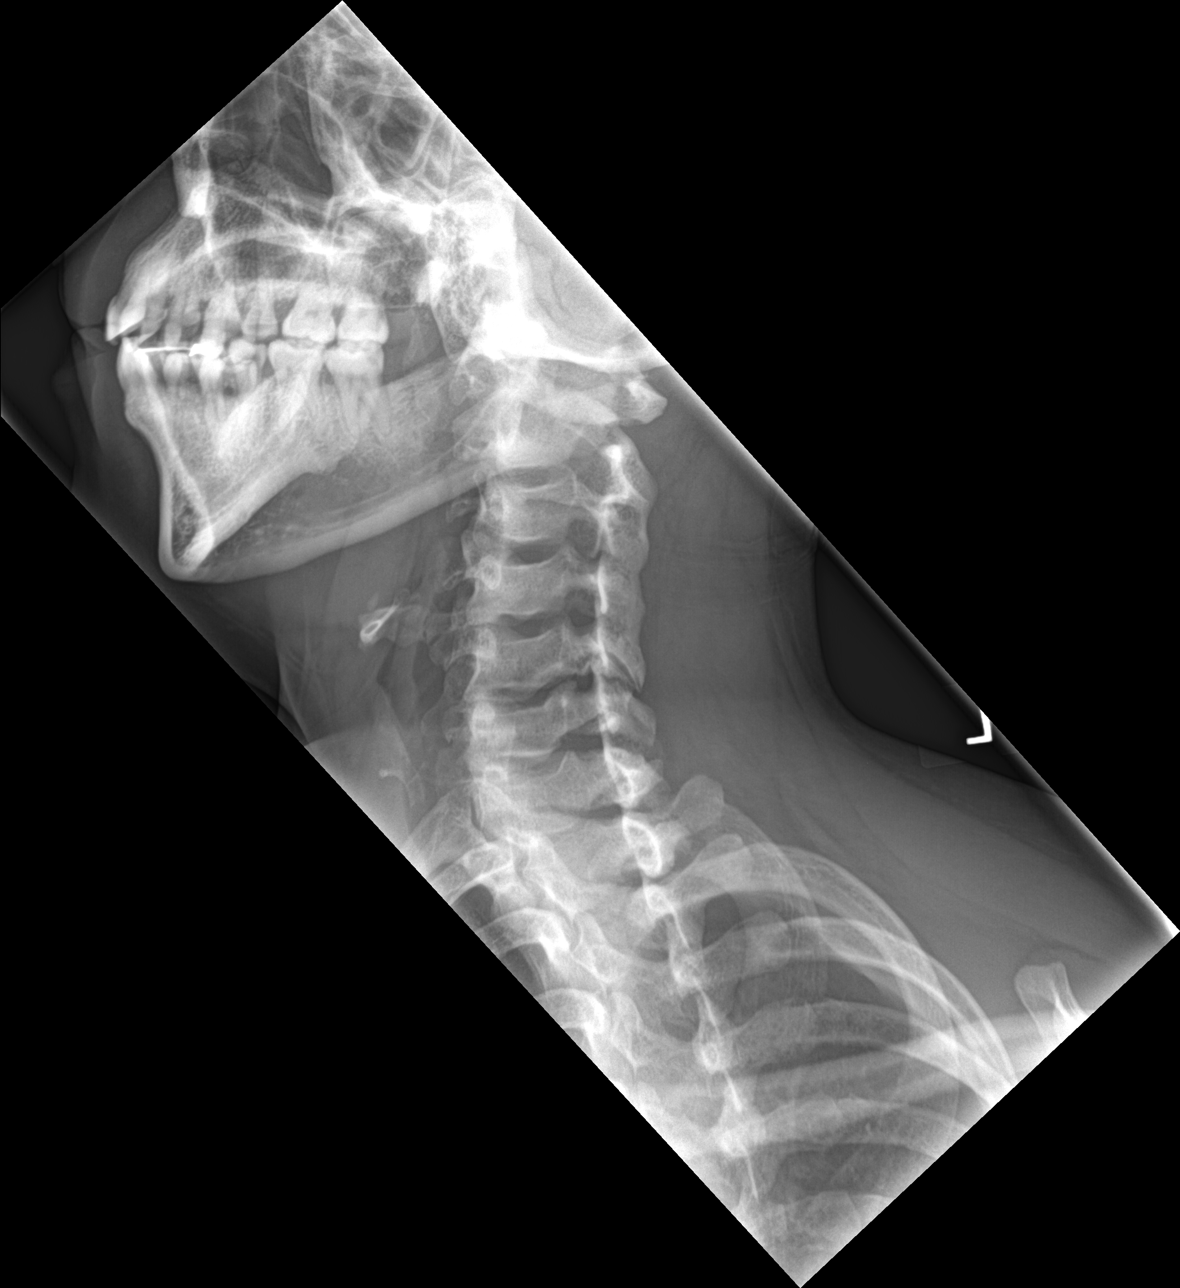

[c-spine ap]
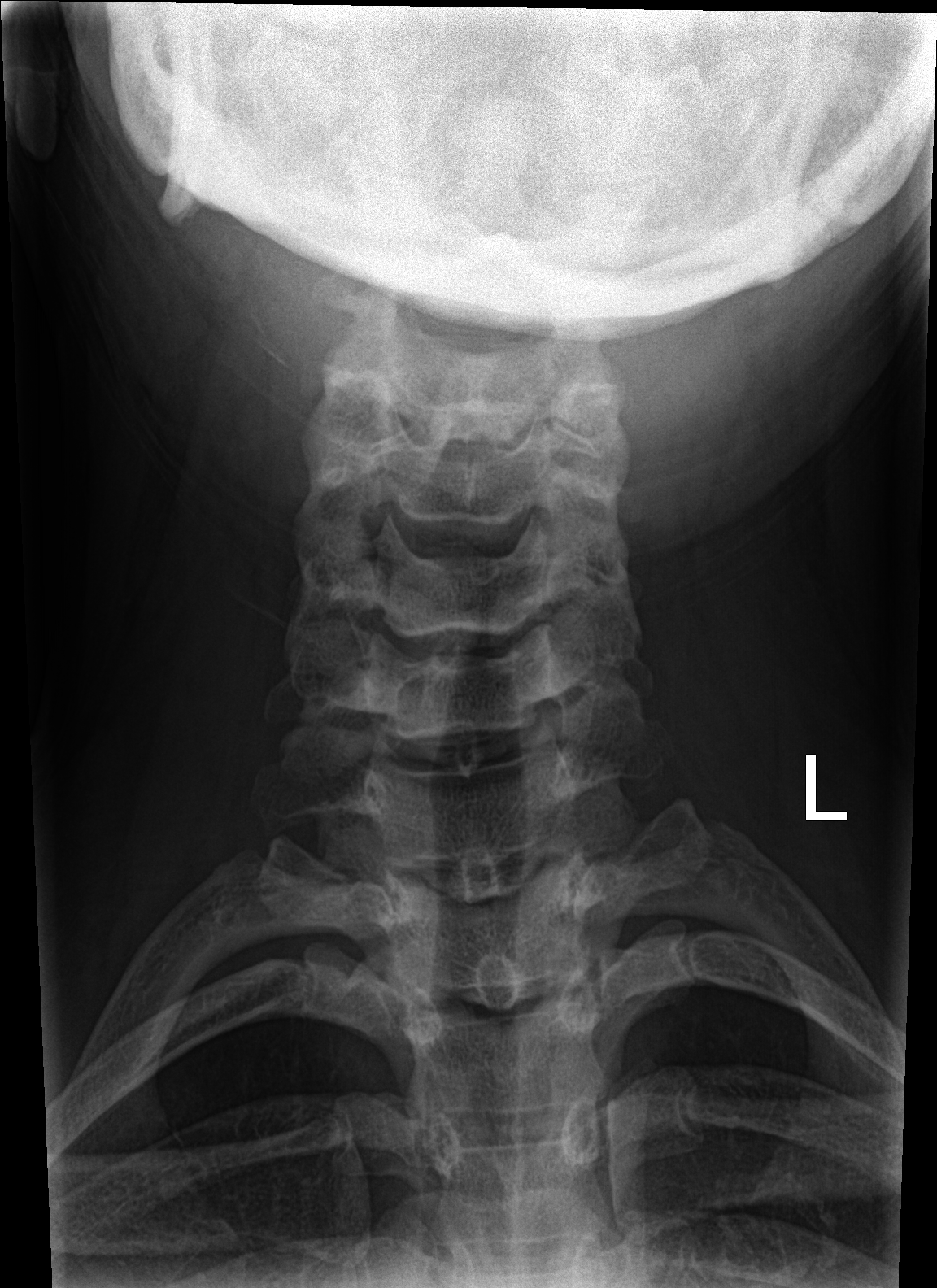

[4 of 4 positions shown; findings below may reference images not displayed]

FINDINGS: No acute bony abnormality identified. Normal alignment. Pulmonary
apices are clear.
IMPRESSION: No acute or focal abnormality.

## 2017-06-02 ENCOUNTER — Ambulatory Visit: Payer: Medicaid Other | Admitting: Family Medicine

## 2017-06-04 ENCOUNTER — Encounter: Payer: Self-pay | Admitting: Family Medicine

## 2017-08-16 ENCOUNTER — Encounter (INDEPENDENT_AMBULATORY_CARE_PROVIDER_SITE_OTHER): Payer: Self-pay

## 2017-08-16 ENCOUNTER — Encounter: Payer: Self-pay | Admitting: Nurse Practitioner

## 2017-08-16 ENCOUNTER — Ambulatory Visit: Payer: Medicaid Other | Admitting: Nurse Practitioner

## 2017-08-16 VITALS — BP 139/85 | HR 84 | Temp 97.2°F | Ht 62.0 in | Wt 175.0 lb

## 2017-08-16 DIAGNOSIS — Z0000 Encounter for general adult medical examination without abnormal findings: Secondary | ICD-10-CM | POA: Diagnosis not present

## 2017-08-16 DIAGNOSIS — Z01419 Encounter for gynecological examination (general) (routine) without abnormal findings: Secondary | ICD-10-CM

## 2017-08-16 DIAGNOSIS — F419 Anxiety disorder, unspecified: Secondary | ICD-10-CM

## 2017-08-16 DIAGNOSIS — B009 Herpesviral infection, unspecified: Secondary | ICD-10-CM

## 2017-08-16 DIAGNOSIS — Z1231 Encounter for screening mammogram for malignant neoplasm of breast: Secondary | ICD-10-CM

## 2017-08-16 DIAGNOSIS — Z86010 Personal history of colonic polyps: Secondary | ICD-10-CM

## 2017-08-16 DIAGNOSIS — K219 Gastro-esophageal reflux disease without esophagitis: Secondary | ICD-10-CM

## 2017-08-16 LAB — URINALYSIS, COMPLETE
BILIRUBIN UA: NEGATIVE
Glucose, UA: NEGATIVE
KETONES UA: NEGATIVE
Leukocytes, UA: NEGATIVE
NITRITE UA: NEGATIVE
Protein, UA: NEGATIVE
SPEC GRAV UA: 1.015 (ref 1.005–1.030)
UUROB: 0.2 mg/dL (ref 0.2–1.0)
pH, UA: 8.5 — ABNORMAL HIGH (ref 5.0–7.5)

## 2017-08-16 LAB — MICROSCOPIC EXAMINATION: Renal Epithel, UA: NONE SEEN /hpf

## 2017-08-16 MED ORDER — VALACYCLOVIR HCL 1 G PO TABS: 1000.0000 mg | ORAL_TABLET | Freq: Every day | ORAL | 3 refills | Status: DC

## 2017-08-16 MED ORDER — OMEPRAZOLE 40 MG PO CPDR: 40.0000 mg | DELAYED_RELEASE_CAPSULE | Freq: Every day | ORAL | 3 refills | Status: DC

## 2017-08-16 MED ORDER — LORAZEPAM 0.5 MG PO TABS: 0.5000 mg | ORAL_TABLET | Freq: Two times a day (BID) | ORAL | 1 refills | Status: DC | PRN

## 2017-08-16 NOTE — Progress Notes (Signed)
Subjective:    Patient ID: Emily Grant, female    DOB: Apr 29, 1976, 42 y.o.   MRN: [id number]  HPI  Emily Grant is here today for annual physical exam , pap and  follow up of chronic medical problem.  Outpatient Encounter Medications as of 08/16/2017  Medication Sig  . valACYclovir (VALTREX) 1000 MG tablet Take 1 tablet (1,000 mg total) daily by mouth.      Marland Kitchen LORazepam (ATIVAN) 0.5 MG tablet Take 1 tablet (0.5 mg total) 2 (two) times daily as needed by mouth for anxiety. (Patient not taking: Reported on 08/16/2017)   - anxiety- she has lotsof issues with her children and she was put on ativan years ago- says she take sno mor ethen 1 a day. -HSV2- takes valtrex daily -no recent break outs.   New complaints: Heart burn daily-- has tried prilosec that helps some.  Social history: Works part time- lives with husband and son.    Review of Systems  Constitutional: Negative for activity change and appetite change.  HENT: Negative.   Eyes: Negative for pain.  Respiratory: Negative for shortness of breath.   Cardiovascular: Negative for chest pain, palpitations and leg swelling.  Gastrointestinal: Negative for abdominal pain.  Endocrine: Negative for polydipsia.  Genitourinary: Negative.   Skin: Negative for rash.  Neurological: Negative for dizziness, weakness and headaches.  Hematological: Does not bruise/bleed easily.  Psychiatric/Behavioral: Negative.   All other systems reviewed and are negative.      Objective:   Physical Exam  Constitutional: She is oriented to person, place, and time. She appears well-developed and well-nourished.  HENT:  Nose: Nose normal.  Mouth/Throat: Oropharynx is clear and moist.  Eyes: EOM are normal.  Neck: Trachea normal, normal range of motion and full passive range of motion without pain. Neck supple. No JVD present. Carotid bruit is not present. No thyromegaly present.  Cardiovascular: Normal rate, regular rhythm, normal heart  sounds and intact distal pulses. Exam reveals no gallop and no friction rub.  No murmur heard. Pulmonary/Chest: Effort normal and breath sounds normal.  Abdominal: Soft. Bowel sounds are normal. She exhibits no distension and no mass. There is no tenderness.  Musculoskeletal: Normal range of motion.  Lymphadenopathy:    She has no cervical adenopathy.  Neurological: She is alert and oriented to person, place, and time. She has normal reflexes.  Skin: Skin is warm and dry.  Psychiatric: She has a normal mood and affect. Her behavior is normal. Judgment and thought content normal.   BP 139/85   Pulse 84   Temp (!) 97.2 F (36.2 C) (Oral)   Ht '5\' 2"'  (1.575 m)   Wt 175 lb (79.4 kg)   BMI 32.01 kg/m         Assessment & Plan:  1. Annual physical exam - CBC with Differential/Platelet - CMP14+EGFR - Lipid panel - Thyroid Panel With TSH  2. Gynecologic exam normal - IGP, Aptima HPV, rfx 16/18,45 - Urinalysis, Complete  3. Anxiety Stress management - LORazepam (ATIVAN) 0.5 MG tablet; Take 1 tablet (0.5 mg total) by mouth 2 (two) times daily as needed for anxiety.  Dispense: 30 tablet; Refill: 1  4. HSV-2 infection Safe sex with outbreak - valACYclovir (VALTREX) 1000 MG tablet; Take 1 tablet (1,000 mg total) by mouth daily.  Dispense: 90 tablet; Refill: 3   5. Hx of colonic polyps - Ambulatory referral to Gastroenterology  6. Screening mammogram, encounter for - MM SCREENING BREAST TOMO BILATERAL; Future  7. gerd - omeprazole 40 mg daily  Avoid spicy foods Do not eat 2 hours prior to bedtime   Labs pending Health maintenance reviewed Diet and exercise encouraged Continue all meds Follow up  In 1 year   Pinch, FNP

## 2017-08-16 NOTE — Patient Instructions (Signed)

## 2017-08-17 LAB — CBC WITH DIFFERENTIAL/PLATELET
BASOS ABS: 0 10*3/uL (ref 0.0–0.2)
BASOS: 1 %
EOS (ABSOLUTE): 0.1 10*3/uL (ref 0.0–0.4)
Eos: 1 %
HEMOGLOBIN: 13.7 g/dL (ref 11.1–15.9)
Hematocrit: 39.3 % (ref 34.0–46.6)
IMMATURE GRANS (ABS): 0 10*3/uL (ref 0.0–0.1)
IMMATURE GRANULOCYTES: 0 %
LYMPHS: 45 %
Lymphocytes Absolute: 2.2 10*3/uL (ref 0.7–3.1)
MCH: 31.2 pg (ref 26.6–33.0)
MCHC: 34.9 g/dL (ref 31.5–35.7)
MCV: 90 fL (ref 79–97)
Monocytes Absolute: 0.3 10*3/uL (ref 0.1–0.9)
Monocytes: 6 %
NEUTROS PCT: 47 %
Neutrophils Absolute: 2.3 10*3/uL (ref 1.4–7.0)
PLATELETS: 233 10*3/uL (ref 150–379)
RBC: 4.39 x10E6/uL (ref 3.77–5.28)
RDW: 14.3 % (ref 12.3–15.4)
WBC: 4.9 10*3/uL (ref 3.4–10.8)

## 2017-08-17 LAB — THYROID PANEL WITH TSH
Free Thyroxine Index: 1.6 (ref 1.2–4.9)
T3 Uptake Ratio: 26 % (ref 24–39)
T4, Total: 6.3 ug/dL (ref 4.5–12.0)
TSH: 4.12 u[IU]/mL (ref 0.450–4.500)

## 2017-08-17 LAB — CMP14+EGFR
ALBUMIN: 4.5 g/dL (ref 3.5–5.5)
ALK PHOS: 60 IU/L (ref 39–117)
ALT: 17 IU/L (ref 0–32)
AST: 15 IU/L (ref 0–40)
Albumin/Globulin Ratio: 2 (ref 1.2–2.2)
BUN / CREAT RATIO: 9 (ref 9–23)
BUN: 8 mg/dL (ref 6–24)
Bilirubin Total: 0.2 mg/dL (ref 0.0–1.2)
CO2: 22 mmol/L (ref 20–29)
CREATININE: 0.87 mg/dL (ref 0.57–1.00)
Calcium: 9.3 mg/dL (ref 8.7–10.2)
Chloride: 102 mmol/L (ref 96–106)
GFR calc Af Amer: 96 mL/min/{1.73_m2} (ref >59)
GFR calc non Af Amer: 83 mL/min/{1.73_m2} (ref >59)
GLUCOSE: 78 mg/dL (ref 65–99)
Globulin, Total: 2.2 g/dL (ref 1.5–4.5)
Potassium: 4.2 mmol/L (ref 3.5–5.2)
Sodium: 139 mmol/L (ref 134–144)
Total Protein: 6.7 g/dL (ref 6.0–8.5)

## 2017-08-17 LAB — LIPID PANEL
CHOLESTEROL TOTAL: 177 mg/dL (ref 100–199)
Chol/HDL Ratio: 2.5 ratio (ref 0.0–4.4)
HDL: 72 mg/dL (ref >39)
LDL CALC: 86 mg/dL (ref 0–99)
Triglycerides: 96 mg/dL (ref 0–149)
VLDL CHOLESTEROL CAL: 19 mg/dL (ref 5–40)

## 2017-08-20 LAB — IGP, APTIMA HPV, RFX 16/18,45
HPV Aptima: POSITIVE — AB
HPV GENOTYPE 16: NEGATIVE
HPV GENOTYPE 18,45: NEGATIVE
PAP Smear Comment: 0

## 2017-08-23 ENCOUNTER — Telehealth: Payer: Self-pay | Admitting: Family Medicine

## 2017-08-23 ENCOUNTER — Encounter: Payer: Self-pay | Admitting: Gastroenterology

## 2017-08-23 NOTE — Telephone Encounter (Signed)
Pt notified of results Verbalizes understanding 

## 2017-09-19 ENCOUNTER — Encounter: Payer: Self-pay | Admitting: Nurse Practitioner

## 2017-09-23 ENCOUNTER — Telehealth: Payer: Self-pay | Admitting: Family Medicine

## 2017-09-23 ENCOUNTER — Ambulatory Visit (INDEPENDENT_AMBULATORY_CARE_PROVIDER_SITE_OTHER): Payer: Medicaid Other | Admitting: *Deleted

## 2017-09-23 DIAGNOSIS — Z23 Encounter for immunization: Secondary | ICD-10-CM

## 2017-09-23 NOTE — Telephone Encounter (Signed)
See below.  Patient's last PPD was in 2017 so it has ran out.  She saw you for a physical in February.  Is it ok if she just comes in for her TB test with a nurse or would you like to see her?

## 2017-09-23 NOTE — Telephone Encounter (Signed)
Notified patient that it has been almost two years. Advised that she could come in and have a TB skin test done without seeing MMM. Pt verbalized understanding

## 2017-09-23 NOTE — Telephone Encounter (Signed)
It is fine for patient to come in and have tb skin test and NOT see me.

## 2017-09-24 DIAGNOSIS — Z111 Encounter for screening for respiratory tuberculosis: Secondary | ICD-10-CM

## 2017-09-26 LAB — TB SKIN TEST
INDURATION: 0 mm
TB SKIN TEST: NEGATIVE

## 2017-10-20 ENCOUNTER — Encounter: Payer: Self-pay | Admitting: Gastroenterology

## 2017-10-20 ENCOUNTER — Telehealth: Payer: Self-pay

## 2017-10-20 ENCOUNTER — Ambulatory Visit: Payer: Medicaid Other | Admitting: Gastroenterology

## 2017-10-20 ENCOUNTER — Other Ambulatory Visit: Payer: Self-pay

## 2017-10-20 VITALS — BP 123/77 | HR 81 | Temp 98.7°F | Ht 62.5 in | Wt 171.2 lb

## 2017-10-20 DIAGNOSIS — Z86010 Personal history of colonic polyps: Secondary | ICD-10-CM

## 2017-10-20 DIAGNOSIS — K219 Gastro-esophageal reflux disease without esophagitis: Secondary | ICD-10-CM

## 2017-10-20 DIAGNOSIS — R1013 Epigastric pain: Secondary | ICD-10-CM

## 2017-10-20 MED ORDER — NA SULFATE-K SULFATE-MG SULF 17.5-3.13-1.6 GM/177ML PO SOLN: 1.0000 | ORAL | 0 refills | Status: DC

## 2017-10-20 MED ORDER — PANTOPRAZOLE SODIUM 40 MG PO TBEC
DELAYED_RELEASE_TABLET | ORAL | 11 refills | Status: DC
Start: 2017-10-20 — End: 2024-04-19

## 2017-10-20 NOTE — Telephone Encounter (Signed)
Called and informed pt of pre-op appt 12/13/17 at 12:45pm. Letter mailed.

## 2017-10-20 NOTE — Progress Notes (Signed)
Subjective:    Patient ID: Emily Grant, female    DOB: [birth year], 42 y.o.   MRN: [id number]  Emily Gamma, MD  HPI HAD FIRST COLONOSCOPY AT [facility] AT AGE ??. 2ND TCS IN 2010: NO POLYPS. HEARTBURN CAN JUST HAPPEN AND CAN FEEL IT COME UP. DIAGNOSED WITH IBS BEEN MANY YEARS AGO: LEFT SIDE PAIN, BOTH DIARRHEA(1-2X/MO) AND CONSTIPATION(MAY BE EVERY DAY/WEEK). BMs: USUALLY Q2-3 DAYS. DOESN'T DRINK ENOUGH WATER. EAT FIBER: GREENS, COLLARDS. WALKING BACK AND FORTH TO WORK NOW THAT SHE DOESN'T HAVE A CAR. HAS HEARTBURN ON THE AM.   PT DENIES FEVER, CHILLS, HEMATOCHEZIA, HEMATEMESIS, nausea, vomiting, melena, diarrhea, CHEST PAIN, SHORTNESS OF BREATH, CHANGE IN BOWEL IN HABITS, abdominal pain, problems swallowing, OR problems with sedation   Past Medical History:  Diagnosis Date  . GERD (gastroesophageal reflux disease)   . Herpes genitalis in women    AGE 52  . IBS (irritable bowel syndrome)    Past Surgical History:  Procedure Laterality Date  . ADENOIDECTOMY     AS A CHILD  . CESAREAN SECTION  1999, 2001   X2  . COLONOSCOPY  2010 [facility]   DEMASON: NO POLYPS   No Known Allergies  Current Outpatient Medications  Medication Sig    . LORazepam (ATIVAN) 0.5 MG tablet Take 1 tablet (0.5 mg total) by mouth 2 (two) times daily as needed for anxiety.    Marland Kitchen omeprazole (PRILOSEC) 40 MG capsule Take 1 capsule (40 mg total) by mouth daily.    . valACYclovir (VALTREX) 1000 MG tablet Take 1 tablet (1,000 mg total) by mouth daily.     Family History  Problem Relation Age of Onset  . Stomach cancer Maternal Grandfather   . Colon cancer Neg Hx   . Colon polyps Neg Hx    Social History   Socioeconomic History  . Marital status: Married    Spouse name: Not on file  . Number of children: Not on file  . Years of education: Not on file  . Highest education level: Not on file  Occupational History  . Not on file  Social Needs  . Financial resource strain: Not on file  . Food  insecurity:    Worry: Not on file    Inability: Not on file  . Transportation needs:    Medical: Not on file    Non-medical: Not on file  Tobacco Use  . Smoking status: Current Every Day Smoker    Packs/day: 0.50    Types: Cigarettes  . Smokeless tobacco: Never Used  Substance and Sexual Activity  . Alcohol use: Yes    Alcohol/week: 0.0 oz    Comment: MAY BE EVERY WUJ:WJXBJ LIGHT, RARE LIQUOR  . Drug use: Yes    Types: Marijuana    Comment: AS OFTEN AS SHE CAN  . Sexual activity: Yes    Partners: Male    Birth control/protection: Surgical    Comment: MARRIED                                                                                     Social History Narrative   KIDS: AGE 94 AND 19(QUIT SCHOOL).  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   Review of Systems PER HPI OTHERWISE ALL SYSTEMS ARE NEGATIVE.    Objective:   Physical Exam  Constitutional: She is oriented to person, place, and time. She appears well-developed and well-nourished. No distress.  HENT:  Head: Normocephalic and atraumatic.  Mouth/Throat: Oropharynx is clear and moist. No oropharyngeal exudate.  Eyes: Pupils are equal, round, and reactive to light. No scleral icterus.  Neck: Normal range of motion. Neck supple.  Cardiovascular: Normal rate, regular rhythm and normal heart sounds.  Pulmonary/Chest: Effort normal and breath sounds normal. No respiratory distress.  Abdominal: Soft. Bowel sounds are normal. She exhibits no distension. There is no tenderness.  Musculoskeletal: She exhibits no edema.  Lymphadenopathy:    She has no cervical adenopathy.  Neurological: She is alert and oriented to person, place, and time.  NO FOCAL DEFICITS  Psychiatric:  SLIGHTLY ANXIOUS MOOD, NL AFFECT  Vitals reviewed.     Assessment & Plan:

## 2017-10-20 NOTE — Assessment & Plan Note (Signed)
SYMPTOMS NOT CONTROLLED.  DRINK WATER TO KEEP YOUR URINE LIGHT YELLOW. CONTINUE YOUR WEIGHT LOSS EFFORTS. FOLLOW A LOW FAT/HIGH FIBER DIET. MEATS SHOULD BE BAKED, BROILED, OR BOILED. AVOID FRIED FOODS. AVOID REFLUX TRIGGERS.  HANDOUT GIVEN. STOP PRILOSEC. CONTINUE PROTONIX. TAKE 30 MINUTES PRIOR TO BREAKFAST.  FOLLOW UP IN 4 MOS.

## 2017-10-20 NOTE — Patient Instructions (Signed)
DRINK WATER TO KEEP YOUR URINE LIGHT YELLOW.  FOLLOW A LOW FAT/HIGH FIBER DIET. MEATS SHOULD BE BAKED, BROILED, OR BOILED. AVOID FRIED FOODS.  AVOID REFLUX TRIGGERS. SEE INFO BELOW.  STOP PRILOSEC. CONTINUE PROTONIX. TAKE 30 MINUTES PRIOR TO BREAKFAST.   COMPLETE COLONOSCOPY AND UPPER ENDOSCOPY IN 2-3 WEEKS WITH PROPIOFOL. YOU MAY BRING THE ENEMA TO ADMINISTER IN THE PREOP AREA.  FOLLOW UP IN 4 MOS.   Lifestyle and home remedies TO CONTROL HEARTBURN You may eliminate or reduce the frequency of heartburn by making the following lifestyle changes:  . Control your weight. Being overweight is a major risk factor for heartburn and GERD. Excess pounds put pressure on your abdomen, pushing up your stomach and causing acid to back up into your esophagus.   . Eat smaller meals. 4 TO 6 MEALS A DAY. This reduces pressure on the lower esophageal sphincter, helping to prevent the valve from opening and acid from washing back into your esophagus.   Allena Earing your belt. Clothes that fit tightly around your waist put pressure on your abdomen and the lower esophageal sphincter.   . Eliminate heartburn triggers. Everyone has specific triggers. Common triggers such as fatty or fried foods, spicy food, tomato sauce, carbonated beverages, alcohol, chocolate, mint, garlic, onion, caffeine and nicotine may make heartburn worse.   Marland Kitchen Avoid stooping or bending. Tying your shoes is OK. Bending over for longer periods to weed your garden isn't, especially soon after eating.   . Don't lie down after a meal. Wait at least three to four hours after eating before going to bed, and don't lie down right after eating.   Alternative medicine . Several home remedies exist for treating GERD, but they provide only temporary relief. They include drinking baking soda (sodium bicarbonate) added to water or drinking other fluids such as baking soda mixed with cream of tartar and water. . Although these liquids create temporary  relief by neutralizing, washing away or buffering acids, eventually they aggravate the situation by adding gas and fluid to your stomach, increasing pressure and causing more acid reflux. Further, adding more sodium to your diet may increase your blood pressure and add stress to your heart, and excessive bicarbonate ingestion can alter the acid-base balance in your body.

## 2017-10-20 NOTE — Progress Notes (Signed)
ON RECALL  °

## 2017-10-20 NOTE — Progress Notes (Signed)
cc'ed to pcp °

## 2017-10-20 NOTE — Assessment & Plan Note (Signed)
NEEDS SURVEILLANCE TCS.  COMPLETE COLONOSCOPY WITH PROPIOFOL. YOU MAY BRING THE ENEMA TO ADMINISTER IN THE PREOP AREA. DISCUSSED PROCEDURE, BENEFITS, & RISKS: < 1% chance of medication reaction, bleeding, perforation, or rupture of spleen/liver.  FOLLOW UP IN 4 MOS.

## 2017-12-12 NOTE — Patient Instructions (Signed)
Emily Grant  12/12/2017     @PREFPERIOPPHARMACY @   Your procedure is scheduled on  12/20/2017 .  [facility] at  [facility]   [facility].  Call this number if you have problems the morning of surgery:  [phone]   Remember:  Do not eat or drink after midnight.  You may drink clear liquids until (follow the instructions given to you) .  Clear liquids allowed are:                    Water, Juice (non-citric and without pulp), Carbonated beverages, Clear Tea, Black Coffee only, Plain Jell-O only, Gatorade and Plain Popsicles only    Take these medicines the morning of surgery with A SIP OF WATER Ativan, protonix.    Do not wear jewelry, make-up or nail polish.  Do not wear lotions, powders, or perfumes, or deodorant.  Do not shave 48 hours prior to surgery.  Men may shave face and neck.  Do not bring valuables to the hospital.  [facility] belongings or valuables.  Contacts, dentures or bridgework may not be worn into surgery.  Leave your suitcase in the car.  After surgery it may be brought to your room.  For patients admitted to the hospital, discharge time will be determined by your treatment team.  Patients discharged the day of surgery will not be allowed to drive home.   Name and phone number of your driver:   family Special instructions:  Follow the diet and prep instructions given to you by Dr Evelina DunField's office.  Please read over the following fact sheets that you were given. Anesthesia Post-op Instructions and Care and Recovery After Surgery       Esophagogastroduodenoscopy Esophagogastroduodenoscopy (EGD) is a procedure to examine the lining of the esophagus, stomach, and first part of the small intestine (duodenum). This procedure is done to check for problems such as inflammation, bleeding, ulcers, or growths. During this procedure, a long, flexible, lighted tube with a camera attached (endoscope) is inserted down  the throat. Tell a health care provider about:  Any allergies you have.  All medicines you are taking, including vitamins, herbs, eye drops, creams, and over-the-counter medicines.  Any problems you or family members have had with anesthetic medicines.  Any blood disorders you have.  Any surgeries you have had.  Any medical conditions you have.  Whether you are pregnant or may be pregnant. What are the risks? Generally, this is a safe procedure. However, problems may occur, including:  Infection.  Bleeding.  A tear (perforation) in the esophagus, stomach, or duodenum.  Trouble breathing.  Excessive sweating.  Spasms of the larynx.  A slowed heartbeat.  Low blood pressure.  What happens before the procedure?  Follow instructions from your health care provider about eating or drinking restrictions.  Ask your health care provider about: ? Changing or stopping your regular medicines. This is especially important if you are taking diabetes medicines or blood thinners. ? Taking medicines such as aspirin and ibuprofen. These medicines can thin your blood. Do not take these medicines before your procedure if your health care provider instructs you not to.  Plan to have someone take you home after the procedure.  If you wear dentures, be ready to remove them before the procedure. What happens during the procedure?  To reduce your risk of infection, your health care  team will wash or sanitize their hands.  An IV tube will be put in a vein in your hand or arm. You will get medicines and fluids through this tube.  You will be given one or more of the following: ? A medicine to help you relax (sedative). ? A medicine to numb the area (local anesthetic). This medicine may be sprayed into your throat. It will make you feel more comfortable and keep you from gagging or coughing during the procedure. ? A medicine for pain.  A mouth guard may be placed in your mouth to protect  your teeth and to keep you from biting on the endoscope.  You will be asked to lie on your left side.  The endoscope will be lowered down your throat into your esophagus, stomach, and duodenum.  Air will be put into the endoscope. This will help your health care provider see better.  The lining of your esophagus, stomach, and duodenum will be examined.  Your health care provider may: ? Take a tissue sample so it can be looked at in a lab (biopsy). ? Remove growths. ? Remove objects (foreign bodies) that are stuck. ? Treat any bleeding with medicines or other devices that stop tissue from bleeding. ? Widen (dilate) or stretch narrowed areas of your esophagus and stomach.  The endoscope will be taken out. The procedure may vary among health care providers and hospitals. What happens after the procedure?  Your blood pressure, heart rate, breathing rate, and blood oxygen level will be monitored often until the medicines you were given have worn off.  Do not eat or drink anything until the numbing medicine has worn off and your gag reflex has returned. This information is not intended to replace advice given to you by your health care provider. Make sure you discuss any questions you have with your health care provider. Document Released: 10/08/2004 Document Revised: 11/13/2015 Document Reviewed: 05/01/2015 Elsevier Interactive Patient Education  2018 [employer]. Esophagogastroduodenoscopy, Care After Refer to this sheet in the next few weeks. These instructions provide you with information about caring for yourself after your procedure. Your health care provider may also give you more specific instructions. Your treatment has been planned according to current medical practices, but problems sometimes occur. Call your health care provider if you have any problems or questions after your procedure. What can I expect after the procedure? After the procedure, it is common to have:  A sore  throat.  Nausea.  Bloating.  Dizziness.  Fatigue.  Follow these instructions at home:  Do not eat or drink anything until the numbing medicine (local anesthetic) has worn off and your gag reflex has returned. You will know that the local anesthetic has worn off when you can swallow comfortably.  Do not drive for 24 hours if you received a medicine to help you relax (sedative).  If your health care provider took a tissue sample for testing during the procedure, make sure to get your test results. This is your responsibility. Ask your health care provider or the department performing the test when your results will be ready.  Keep all follow-up visits as told by your health care provider. This is important. Contact a health care provider if:  You cannot stop coughing.  You are not urinating.  You are urinating less than usual. Get help right away if:  You have trouble swallowing.  You cannot eat or drink.  You have throat or chest pain that gets worse.  You are dizzy or light-headed.  You faint.  You have nausea or vomiting.  You have chills.  You have a fever.  You have severe abdominal pain.  You have black, tarry, or bloody stools. This information is not intended to replace advice given to you by your health care provider. Make sure you discuss any questions you have with your health care provider. Document Released: 05/24/2012 Document Revised: 11/13/2015 Document Reviewed: 05/01/2015 Elsevier Interactive Patient Education  2018 [employer].  Colonoscopy, Adult A colonoscopy is an exam to look at the large intestine. It is done to check for problems, such as:  Lumps (tumors).  Growths (polyps).  Swelling (inflammation).  Bleeding.  What happens before the procedure? Eating and drinking Follow instructions from your doctor about eating and drinking. These instructions may include:  A few days before the procedure - follow a low-fiber  diet. ? Avoid nuts. ? Avoid seeds. ? Avoid dried fruit. ? Avoid raw fruits. ? Avoid vegetables.  1-3 days before the procedure - follow a clear liquid diet. Avoid liquids that have red or purple dye. Drink only clear liquids, such as: ? Clear broth or bouillon. ? Black coffee or tea. ? Clear juice. ? Clear soft drinks or sports drinks. ? Gelatin dessert. ? Popsicles.  On the day of the procedure - do not eat or drink anything during the 2 hours before the procedure.  Bowel prep If you were prescribed an oral bowel prep:  Take it as told by your doctor. Starting the day before your procedure, you will need to drink a lot of liquid. The liquid will cause you to poop (have bowel movements) until your poop is almost clear or light green.  If your skin or butt gets irritated from diarrhea, you may: ? Wipe the area with wipes that have medicine in them, such as adult wet wipes with aloe and vitamin E. ? Put something on your skin that soothes the area, such as petroleum jelly.  If you throw up (vomit) while drinking the bowel prep, take a break for up to 60 minutes. Then begin the bowel prep again. If you keep throwing up and you cannot take the bowel prep without throwing up, call your doctor.  General instructions  Ask your doctor about changing or stopping your normal medicines. This is important if you take diabetes medicines or blood thinners.  Plan to have someone take you home from the hospital or clinic. What happens during the procedure?  An IV tube may be put into one of your veins.  You will be given medicine to help you relax (sedative).  To reduce your risk of infection: ? Your doctors will wash their hands. ? Your anal area will be washed with soap.  You will be asked to lie on your side with your knees bent.  Your doctor will get a long, thin, flexible tube ready. The tube will have a camera and a light on the end.  The tube will be put into your anus.  The  tube will be gently put into your large intestine.  Air will be delivered into your large intestine to keep it open. You may feel some pressure or cramping.  The camera will be used to take photos.  A small tissue sample may be removed from your body to be looked at under a microscope (biopsy). If any possible problems are found, the tissue will be sent to a lab for testing.  If small growths are  found, your doctor may remove them and have them checked for cancer.  The tube that was put into your anus will be slowly removed. The procedure may vary among doctors and hospitals. What happens after the procedure?  Your doctor will check on you often until the medicines you were given have worn off.  Do not drive for 24 hours after the procedure.  You may have a small amount of blood in your poop.  You may pass gas.  You may have mild cramps or bloating in your belly (abdomen).  It is up to you to get the results of your procedure. Ask your doctor, or the department performing the procedure, when your results will be ready. This information is not intended to replace advice given to you by your health care provider. Make sure you discuss any questions you have with your health care provider. Document Released: 07/10/2010 Document Revised: 04/07/2016 Document Reviewed: 08/19/2015 Elsevier Interactive Patient Education  2017 [employer]  Colonoscopy, Adult, Care After This sheet gives you information about how to care for yourself after your procedure. Your health care provider may also give you more specific instructions. If you have problems or questions, contact your health care provider. What can I expect after the procedure? After the procedure, it is common to have:  A small amount of blood in your stool for 24 hours after the procedure.  Some gas.  Mild abdominal cramping or bloating.  Follow these instructions at home: General instructions   For the first 24 hours  after the procedure: ? Do not drive or use machinery. ? Do not sign important documents. ? Do not drink alcohol. ? Do your regular daily activities at a slower pace than normal. ? Eat soft, easy-to-digest foods. ? Rest often.  Take over-the-counter or prescription medicines only as told by your health care provider.  It is up to you to get the results of your procedure. Ask your health care provider, or the department performing the procedure, when your results will be ready. Relieving cramping and bloating  Try walking around when you have cramps or feel bloated.  Apply heat to your abdomen as told by your health care provider. Use a heat source that your health care provider recommends, such as a moist heat pack or a heating pad. ? Place a towel between your skin and the heat source. ? Leave the heat on for 20-30 minutes. ? Remove the heat if your skin turns bright red. This is especially important if you are unable to feel pain, heat, or cold. You may have a greater risk of getting burned. Eating and drinking  Drink enough fluid to keep your urine clear or pale yellow.  Resume your normal diet as instructed by your health care provider. Avoid heavy or fried foods that are hard to digest.  Avoid drinking alcohol for as long as instructed by your health care provider. Contact a health care provider if:  You have blood in your stool 2-3 days after the procedure. Get help right away if:  You have more than a small spotting of blood in your stool.  You pass large blood clots in your stool.  Your abdomen is swollen.  You have nausea or vomiting.  You have a fever.  You have increasing abdominal pain that is not relieved with medicine. This information is not intended to replace advice given to you by your health care provider. Make sure you discuss any questions you have with your health  care provider. Document Released: 01/20/2004 Document Revised: 03/01/2016 Document  Reviewed: 08/19/2015 Elsevier Interactive Patient Education  2018 [place] Anesthesia is a term that refers to techniques, procedures, and medicines that help a person stay safe and comfortable during a medical procedure. Monitored anesthesia care, or sedation, is one type of anesthesia. Your anesthesia specialist may recommend sedation if you will be having a procedure that does not require you to be unconscious, such as:  Cataract surgery.  A dental procedure.  A biopsy.  A colonoscopy.  During the procedure, you may receive a medicine to help you relax (sedative). There are three levels of sedation:  Mild sedation. At this level, you may feel awake and relaxed. You will be able to follow directions.  Moderate sedation. At this level, you will be sleepy. You may not remember the procedure.  Deep sedation. At this level, you will be asleep. You will not remember the procedure.  The more medicine you are given, the deeper your level of sedation will be. Depending on how you respond to the procedure, the anesthesia specialist may change your level of sedation or the type of anesthesia to fit your needs. An anesthesia specialist will monitor you closely during the procedure. Let your health care provider know about:  Any allergies you have.  All medicines you are taking, including vitamins, herbs, eye drops, creams, and over-the-counter medicines.  Any use of steroids (by mouth or as a cream).  Any problems you or family members have had with sedatives and anesthetic medicines.  Any blood disorders you have.  Any surgeries you have had.  Any medical conditions you have, such as sleep apnea.  Whether you are pregnant or may be pregnant.  Any use of cigarettes, alcohol, or street drugs. What are the risks? Generally, this is a safe procedure. However, problems may occur, including:  Getting too much medicine  (oversedation).  Nausea.  Allergic reaction to medicines.  Trouble breathing. If this happens, a breathing tube may be used to help with breathing. It will be removed when you are awake and breathing on your own.  Heart trouble.  Lung trouble.  Before the procedure Staying hydrated Follow instructions from your health care provider about hydration, which may include:  Up to 2 hours before the procedure - you may continue to drink clear liquids, such as water, clear fruit juice, black coffee, and plain tea.  Eating and drinking restrictions Follow instructions from your health care provider about eating and drinking, which may include:  8 hours before the procedure - stop eating heavy meals or foods such as meat, fried foods, or fatty foods.  6 hours before the procedure - stop eating light meals or foods, such as toast or cereal.  6 hours before the procedure - stop drinking milk or drinks that contain milk.  2 hours before the procedure - stop drinking clear liquids.  Medicines Ask your health care provider about:  Changing or stopping your regular medicines. This is especially important if you are taking diabetes medicines or blood thinners.  Taking medicines such as aspirin and ibuprofen. These medicines can thin your blood. Do not take these medicines before your procedure if your health care provider instructs you not to.  Tests and exams  You will have a physical exam.  You may have blood tests done to show: ? How well your kidneys and liver are working. ? How well your blood can clot.  General instructions  Plan  to have someone take you home from the hospital or clinic.  If you will be going home right after the procedure, plan to have someone with you for 24 hours.  What happens during the procedure?  Your blood pressure, heart rate, breathing, level of pain and overall condition will be monitored.  An IV tube will be inserted into one of your  veins.  Your anesthesia specialist will give you medicines as needed to keep you comfortable during the procedure. This may mean changing the level of sedation.  The procedure will be performed. After the procedure  Your blood pressure, heart rate, breathing rate, and blood oxygen level will be monitored until the medicines you were given have worn off.  Do not drive for 24 hours if you received a sedative.  You may: ? Feel sleepy, clumsy, or nauseous. ? Feel forgetful about what happened after the procedure. ? Have a sore throat if you had a breathing tube during the procedure. ? Vomit. This information is not intended to replace advice given to you by your health care provider. Make sure you discuss any questions you have with your health care provider. Document Released: 03/03/2005 Document Revised: 11/14/2015 Document Reviewed: 09/28/2015 Elsevier Interactive Patient Education  2018 [place], Care After These instructions provide you with information about caring for yourself after your procedure. Your health care provider may also give you more specific instructions. Your treatment has been planned according to current medical practices, but problems sometimes occur. Call your health care provider if you have any problems or questions after your procedure. What can I expect after the procedure? After your procedure, it is common to:  Feel sleepy for several hours.  Feel clumsy and have poor balance for several hours.  Feel forgetful about what happened after the procedure.  Have poor judgment for several hours.  Feel nauseous or vomit.  Have a sore throat if you had a breathing tube during the procedure.  Follow these instructions at home: For at least 24 hours after the procedure:   Do not: ? Participate in activities in which you could fall or become injured. ? Drive. ? Use heavy machinery. ? Drink alcohol. ? Take sleeping pills or  medicines that cause drowsiness. ? Make important decisions or sign legal documents. ? Take care of children on your own.  Rest. Eating and drinking  Follow the diet that is recommended by your health care provider.  If you vomit, drink water, juice, or soup when you can drink without vomiting.  Make sure you have little or no nausea before eating solid foods. General instructions  Have a responsible adult stay with you until you are awake and alert.  Take over-the-counter and prescription medicines only as told by your health care provider.  If you smoke, do not smoke without supervision.  Keep all follow-up visits as told by your health care provider. This is important. Contact a health care provider if:  You keep feeling nauseous or you keep vomiting.  You feel light-headed.  You develop a rash.  You have a fever. Get help right away if:  You have trouble breathing. This information is not intended to replace advice given to you by your health care provider. Make sure you discuss any questions you have with your health care provider. Document Released: 09/28/2015 Document Revised: 01/28/2016 Document Reviewed: 09/28/2015 Elsevier Interactive Patient Education  [employer].

## 2017-12-13 ENCOUNTER — Encounter (HOSPITAL_COMMUNITY)
Admission: RE | Admit: 2017-12-13 | Discharge: 2017-12-13 | Disposition: A | Discharge status: Home / Self Care | Payer: Medicaid Other | Source: Ambulatory Visit | Attending: Gastroenterology | Admitting: Gastroenterology

## 2017-12-13 ENCOUNTER — Other Ambulatory Visit: Payer: Self-pay

## 2017-12-13 ENCOUNTER — Telehealth: Payer: Self-pay

## 2017-12-13 ENCOUNTER — Encounter (HOSPITAL_COMMUNITY): Payer: Self-pay

## 2017-12-13 DIAGNOSIS — Z01812 Encounter for preprocedural laboratory examination: Secondary | ICD-10-CM | POA: Diagnosis not present

## 2017-12-13 HISTORY — DX: Panic disorder (episodic paroxysmal anxiety): F41.0

## 2017-12-13 HISTORY — DX: Anxiety disorder, unspecified: F41.9

## 2017-12-13 LAB — HCG, SERUM, QUALITATIVE: Preg, Serum: NEGATIVE

## 2017-12-13 NOTE — Telephone Encounter (Signed)
Endo scheduler called office. Pt unable to arrive at 6:15 am 12/20/17 for TCS/EGD d/t transportation. Scheduler moved procedure to 8:45am.

## 2017-12-20 ENCOUNTER — Encounter (HOSPITAL_COMMUNITY): Payer: Self-pay | Admitting: *Deleted

## 2017-12-20 ENCOUNTER — Ambulatory Visit (HOSPITAL_COMMUNITY): Payer: Medicaid Other | Admitting: Anesthesiology

## 2017-12-20 ENCOUNTER — Encounter (HOSPITAL_COMMUNITY)
Admission: RE | Disposition: A | Discharge status: Home / Self Care | Payer: Self-pay | Source: Ambulatory Visit | Attending: Gastroenterology

## 2017-12-20 ENCOUNTER — Ambulatory Visit (HOSPITAL_COMMUNITY)
Admission: RE | Admit: 2017-12-20 | Discharge: 2017-12-20 | Disposition: A | Discharge status: Home / Self Care | Payer: Medicaid Other | Source: Ambulatory Visit | Attending: Gastroenterology | Admitting: Gastroenterology

## 2017-12-20 DIAGNOSIS — K5730 Diverticulosis of large intestine without perforation or abscess without bleeding: Secondary | ICD-10-CM | POA: Insufficient documentation

## 2017-12-20 DIAGNOSIS — Z86010 Personal history of colonic polyps: Secondary | ICD-10-CM | POA: Diagnosis not present

## 2017-12-20 DIAGNOSIS — K449 Diaphragmatic hernia without obstruction or gangrene: Secondary | ICD-10-CM | POA: Insufficient documentation

## 2017-12-20 DIAGNOSIS — R1013 Epigastric pain: Secondary | ICD-10-CM

## 2017-12-20 DIAGNOSIS — F410 Panic disorder [episodic paroxysmal anxiety] without agoraphobia: Secondary | ICD-10-CM | POA: Insufficient documentation

## 2017-12-20 DIAGNOSIS — Z1211 Encounter for screening for malignant neoplasm of colon: Secondary | ICD-10-CM | POA: Diagnosis not present

## 2017-12-20 DIAGNOSIS — K219 Gastro-esophageal reflux disease without esophagitis: Secondary | ICD-10-CM | POA: Insufficient documentation

## 2017-12-20 DIAGNOSIS — K648 Other hemorrhoids: Secondary | ICD-10-CM | POA: Diagnosis not present

## 2017-12-20 DIAGNOSIS — Z79899 Other long term (current) drug therapy: Secondary | ICD-10-CM | POA: Insufficient documentation

## 2017-12-20 DIAGNOSIS — Z8719 Personal history of other diseases of the digestive system: Secondary | ICD-10-CM | POA: Insufficient documentation

## 2017-12-20 DIAGNOSIS — K644 Residual hemorrhoidal skin tags: Secondary | ICD-10-CM | POA: Diagnosis not present

## 2017-12-20 DIAGNOSIS — K2970 Gastritis, unspecified, without bleeding: Secondary | ICD-10-CM | POA: Diagnosis not present

## 2017-12-20 DIAGNOSIS — Q439 Congenital malformation of intestine, unspecified: Secondary | ICD-10-CM | POA: Insufficient documentation

## 2017-12-20 DIAGNOSIS — K621 Rectal polyp: Secondary | ICD-10-CM

## 2017-12-20 DIAGNOSIS — F17210 Nicotine dependence, cigarettes, uncomplicated: Secondary | ICD-10-CM | POA: Insufficient documentation

## 2017-12-20 DIAGNOSIS — A6000 Herpesviral infection of urogenital system, unspecified: Secondary | ICD-10-CM | POA: Insufficient documentation

## 2017-12-20 HISTORY — PX: POLYPECTOMY: SHX5525

## 2017-12-20 HISTORY — PX: ESOPHAGOGASTRODUODENOSCOPY (EGD) WITH PROPOFOL: SHX5813

## 2017-12-20 HISTORY — PX: COLONOSCOPY WITH PROPOFOL: SHX5780

## 2017-12-20 HISTORY — PX: BIOPSY: SHX5522

## 2017-12-20 SURGERY — COLONOSCOPY WITH PROPOFOL
Anesthesia: Monitor Anesthesia Care

## 2017-12-20 MED ORDER — PROMETHAZINE HCL 25 MG/ML IJ SOLN: 6.2500 mg | INTRAMUSCULAR | Status: DC | PRN

## 2017-12-20 MED ORDER — PROPOFOL 500 MG/50ML IV EMUL
INTRAVENOUS | Status: DC | PRN
  Administered 2017-12-20: 09:00:00 via INTRAVENOUS
  Administered 2017-12-20: 150 ug/kg/min via INTRAVENOUS
  Administered 2017-12-20: 09:00:00 via INTRAVENOUS

## 2017-12-20 MED ORDER — MIDAZOLAM HCL 5 MG/5ML IJ SOLN
INTRAMUSCULAR | Status: DC | PRN
  Administered 2017-12-20: 2 mg via INTRAVENOUS

## 2017-12-20 MED ORDER — MIDAZOLAM HCL 2 MG/2ML IJ SOLN
INTRAMUSCULAR | Status: AC
  Filled 2017-12-20: qty 2

## 2017-12-20 MED ORDER — PROPOFOL 10 MG/ML IV BOLUS
INTRAVENOUS | Status: DC | PRN
  Administered 2017-12-20: 30 mg via INTRAVENOUS
  Administered 2017-12-20 (×2): 20 mg via INTRAVENOUS

## 2017-12-20 MED ORDER — CHLORHEXIDINE GLUCONATE CLOTH 2 % EX PADS: 6.0000 | MEDICATED_PAD | Freq: Once | CUTANEOUS | Status: DC

## 2017-12-20 MED ORDER — LACTATED RINGERS IV SOLN: INTRAVENOUS | Status: DC

## 2017-12-20 MED ORDER — HYDROCODONE-ACETAMINOPHEN 7.5-325 MG PO TABS: 1.0000 | ORAL_TABLET | Freq: Once | ORAL | Status: DC | PRN

## 2017-12-20 MED ORDER — FENTANYL CITRATE (PF) 100 MCG/2ML IJ SOLN
INTRAMUSCULAR | Status: DC | PRN
  Administered 2017-12-20: 150 ug via INTRAVENOUS

## 2017-12-20 MED ORDER — PROPOFOL 10 MG/ML IV BOLUS
INTRAVENOUS | Status: AC
  Filled 2017-12-20: qty 40

## 2017-12-20 MED ORDER — LACTATED RINGERS IV SOLN
INTRAVENOUS | Status: DC
  Administered 2017-12-20: 1000 mL via INTRAVENOUS

## 2017-12-20 MED ORDER — LIDOCAINE VISCOUS HCL 2 % MT SOLN
OROMUCOSAL | Status: AC
Start: 2017-12-20 — End: ?
  Filled 2017-12-20: qty 15

## 2017-12-20 MED ORDER — MEPERIDINE HCL 100 MG/ML IJ SOLN: 6.2500 mg | INTRAMUSCULAR | Status: DC | PRN

## 2017-12-20 MED ORDER — HYDROMORPHONE HCL 1 MG/ML IJ SOLN: 0.2500 mg | INTRAMUSCULAR | Status: DC | PRN

## 2017-12-20 NOTE — Op Note (Signed)
[facility] Patient Name: Emily Grant Procedure Date: 12/20/2017 8:25 AM MRN: [id number] Date of Birth: 1975/12/24 Attending MD: Jonette Eva MD, MD CSN: [id number] Age: 42 Admit Type: Outpatient Procedure:                Colonoscopy WITH COLD SNARE POLYPECTOMY Indications:              Personal history of colonic polyps. Last TCS MAR                            2008: Hyperplastic polyp Providers:                Jonette Eva MD, MD, Criselda Peaches. Patsy Lager, RN,                            Burke Keels, Technician Referring MD:             Pamala Duffel. Ermalinda Memos Medicines:                Propofol per Anesthesia Complications:            No immediate complications. Estimated Blood Loss:     Estimated blood loss was minimal. Procedure:                Pre-Anesthesia Assessment:                           - Prior to the procedure, a History and Physical                            was performed, and patient medications and                            allergies were reviewed. The patient's tolerance of                            previous anesthesia was also reviewed. The risks                            and benefits of the procedure and the sedation                            options and risks were discussed with the patient.                            All questions were answered, and informed consent                            was obtained. Prior Anticoagulants: The patient has                            taken no previous anticoagulant or antiplatelet                            agents. ASA Grade Assessment: I - A normal, healthy  patient. After reviewing the risks and benefits,                            the patient was deemed in satisfactory condition to                            undergo the procedure. After obtaining informed                            consent, the colonoscope was passed under direct                            vision. Throughout the procedure, the  patient's                            blood pressure, pulse, and oxygen saturations were                            monitored continuously. The [id number])                            scope was introduced through the anus and advanced                            to the the cecum, identified by appendiceal orifice                            and ileocecal valve. The colonoscopy was somewhat                            difficult due to a tortuous colon. Successful                            completion of the procedure was aided by                            straightening and shortening the scope to obtain                            bowel loop reduction and COLOWRAP. The patient                            tolerated the procedure fairly well. The quality of                            the bowel preparation was excellent. The ileocecal                            valve, appendiceal orifice, and rectum were                            photographed. Scope In: 8:58:09 AM Scope Out: 9:15:06 AM Scope Withdrawal Time: 0 hours 13 minutes 54 seconds  Total Procedure Duration:  0 hours 16 minutes 57 seconds  Findings:      A 4 mm polyp was found in the rectum. The polyp was sessile. The polyp       was removed with a cold snare. Resection and retrieval were complete.      Multiple small and large-mouthed diverticula were found in the       recto-sigmoid colon, sigmoid colon and ascending colon.      The recto-sigmoid colon and sigmoid colon were significantly tortuous.      External and internal hemorrhoids were found. The hemorrhoids were       moderate. Impression:               - One 4 mm polyp in the rectum, removed with a cold                            snare. Resected and retrieved.                           - Diverticulosis in the recto-sigmoid colon, in the                            sigmoid colon and in the ascending colon.                           - Tortuous colon.                            - External and internal hemorrhoids. Moderate Sedation:      Per Anesthesia Care Recommendation:           - Repeat colonoscopy in 5-10 years for surveillance.                           - High fiber diet and low fat diet.                           - Continue present medications.                           - Await pathology results.                           - Return to my office in 2 months.                           - Patient has a contact number available for                            emergencies. The signs and symptoms of potential                            delayed complications were discussed with the                            patient. Return to normal activities tomorrow.  Written discharge instructions were provided to the                            patient. Procedure Code(s):        --- Professional ---                           [phone], Colonoscopy, flexible; with removal of                            tumor(s), polyp(s), or other lesion(s) by snare                            technique Diagnosis Code(s):        --- Professional ---                           K62.1, Rectal polyp                           K64.8, Other hemorrhoids                           Z86.010, Personal history of colonic polyps                           K57.30, Diverticulosis of large intestine without                            perforation or abscess without bleeding                           Q43.8, Other specified congenital malformations of                            intestine CPT copyright 2017 [employer]. All rights reserved. The codes documented in this report are preliminary and upon coder review may  be revised to meet current compliance requirements. Jonette Eva, MD Jonette Eva MD, MD 12/20/2017 9:43:52 AM This report has been signed electronically. Number of Addenda: 0

## 2017-12-20 NOTE — Op Note (Signed)
[facility] Patient Name: Emily Grant Procedure Date: 12/20/2017 9:19 AM MRN: [id number] Date of Birth: 1975-10-29 Attending MD: Jonette Eva MD, MD CSN: [id number] Age: 42 Admit Type: Outpatient Procedure:                Upper GI endoscopy WITH COLD FORCEPS BIOPSY Indications:              Dyspepsia Providers:                Jonette Eva MD, MD, Brain Hilts RN, RN, Burke Keels, Technician Referring MD:             Pamala Duffel. Ermalinda Memos Medicines:                Propofol per Anesthesia Complications:            No immediate complications. Estimated Blood Loss:     Estimated blood loss was minimal. Procedure:                Pre-Anesthesia Assessment:                           - Prior to the procedure, a History and Physical                            was performed, and patient medications and                            allergies were reviewed. The patient's tolerance of                            previous anesthesia was also reviewed. The risks                            and benefits of the procedure and the sedation                            options and risks were discussed with the patient.                            All questions were answered, and informed consent                            was obtained. Prior Anticoagulants: The patient has                            taken no previous anticoagulant or antiplatelet                            agents. ASA Grade Assessment: I - A normal, healthy                            patient. After reviewing the risks and benefits,  the patient was deemed in satisfactory condition to                            undergo the procedure. After obtaining informed                            consent, the endoscope was passed under direct                            vision. Throughout the procedure, the patient's                            blood pressure, pulse, and oxygen saturations were                           monitored continuously. The [id number] ([id number])                            scope was introduced through the mouth, and                            advanced to the second part of duodenum. The upper                            GI endoscopy was accomplished without difficulty.                            The patient tolerated the procedure well. Scope In: 9:22:55 AM Scope Out: 9:30:52 AM Total Procedure Duration: 0 hours 7 minutes 57 seconds  Findings:      The examined esophagus was normal.      A small hiatal hernia was present.      Patchy mild inflammation characterized by congestion (edema) and       erythema was found in the gastric antrum. Biopsies were taken with a       cold forceps for Helicobacter pylori testing.      The examined duodenum was normal. Impression:               - Small hiatal hernia.                           - DYSPEPSIA DUE TO GERD/MILD Gastritis. Biopsied. Moderate Sedation:      Per Anesthesia Care Recommendation:           - Await pathology results.                           - High fiber diet and low fat diet. AVOID REFLUX                            TRIGGERS. LOSE WEIGHT TO BMI < 30.                           - Continue present medications.                           -  Return to my office in 2 months.                           - Patient has a contact number available for                            emergencies. The signs and symptoms of potential                            delayed complications were discussed with the                            patient. Return to normal activities tomorrow.                            Written discharge instructions were provided to the                            patient. Procedure Code(s):        --- Professional ---                           [phone], Esophagogastroduodenoscopy, flexible,                            transoral; with biopsy, single or multiple Diagnosis Code(s):        --- Professional ---                            K44.9, Diaphragmatic hernia without obstruction or                            gangrene                           K29.70, Gastritis, unspecified, without bleeding                           R10.13, Epigastric pain CPT copyright 2017 American [employer] Association. All rights reserved. The codes documented in this report are preliminary and upon coder review may  be revised to meet current compliance requirements. Jonette EvaSandi Cabela Pacifico, MD Jonette EvaSandi Jannie Doyle MD, MD 12/20/2017 9:59:23 AM This report has been signed electronically. Number of Addenda: 0

## 2017-12-20 NOTE — Anesthesia Postprocedure Evaluation (Signed)
Anesthesia Post Note  Patient: JAIDE HILLENBURG  Procedure(s) Performed: COLONOSCOPY WITH PROPOFOL (N/A ) ESOPHAGOGASTRODUODENOSCOPY (EGD) WITH PROPOFOL (N/A ) POLYPECTOMY BIOPSY  Patient location during evaluation: PACU Anesthesia Type: MAC Level of consciousness: awake and alert and oriented Pain management: pain level controlled Vital Signs Assessment: post-procedure vital signs reviewed and stable Respiratory status: spontaneous breathing Cardiovascular status: blood pressure returned to baseline Postop Assessment: no apparent nausea or vomiting Anesthetic complications: no     Last Vitals:  Vitals:   12/20/17 0730 12/20/17 0941  BP: (!) 147/83 (!) (P) 144/80  Pulse:  97  Resp: (!) 22 (!) 21  Temp:  36.7 C  SpO2: 99% (P) 99%    Last Pain:  Vitals:   12/20/17 0843  TempSrc:   PainSc: 0-No pain                 Billie Intriago

## 2017-12-20 NOTE — Anesthesia Preprocedure Evaluation (Addendum)
Anesthesia Evaluation  Patient identified by MRN, date of birth, ID band Patient awake    Reviewed: Allergy & Precautions, H&P , NPO status , Patient's Chart, lab work & pertinent test results, reviewed documented beta blocker date and time   Airway Mallampati: II  TM Distance: >3 FB Neck ROM: full    Dental no notable dental hx. 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   Dental Advisory Given  Plan Discussed with: CRNA and Anesthesiologist  Anesthesia Plan Comments:         Anesthesia Quick Evaluation

## 2017-12-20 NOTE — Discharge Instructions (Signed)
You had 1 polyp removed FROM YOUR RECTUM.  You have MODERATE internal AND EXTERNAL hemorrhoids & DIVERTICULOSIS IN YOUR RIGHT AND LEFT COLON. You have MILD gastritis & a SMALL HIATAL HERNIA. I biopsied your stomach.    DRINK WATER TO KEEP YOUR URINE LIGHT YELLOW.  CONTINUE YOUR WEIGHT LOSS EFFORTS. LOSE 10 POUNDS.  FOLLOW A HIGH FIBER/LOW FAT DIET. AVOID ITEMS THAT CAUSE BLOATING. SEE INFO BELOW.  AVOID REFLUX TRIGGERS. FOLLOW INSTRUCTION THAT I GAVE YOU IN THE OFFICE.   YOUR BIOPSY RESULTS WILL BE BACK IN 7 DAYS.  FOLLOW UP IN SEP 2019.  Next colonoscopy in 5-10 years.   ENDOSCOPY Care After Read the instructions outlined below and refer to this sheet in the next week. These discharge instructions provide you with general information on caring for yourself after you leave the hospital. While your treatment has been planned according to the most current medical practices available, unavoidable complications occasionally occur. If you have any problems or questions after discharge, call DR. Jazell Rosenau, [phone].  ACTIVITY  You may resume your regular activity, but move at a slower pace for the next 24 hours.   Take frequent rest periods for the next 24 hours.   Walking will help get rid of the air and reduce the bloated feeling in your belly (abdomen).   No driving for 24 hours (because of the medicine (anesthesia) used during the test).   You may shower.   Do not sign any important legal documents or operate any machinery for 24 hours (because of the anesthesia used during the test).    NUTRITION  Drink plenty of fluids.   You may resume your normal diet as instructed by your doctor.   Begin with a light meal and progress to your normal diet. Heavy or fried foods are harder to digest and may make you feel sick to your stomach (nauseated).   Avoid alcoholic beverages for 24 hours or as instructed.    MEDICATIONS  You may resume your normal medications.   WHAT YOU  CAN EXPECT TODAY  Some feelings of bloating in the abdomen.   Passage of more gas than usual.   Spotting of blood in your stool or on the toilet paper  .  IF YOU HAD POLYPS REMOVED DURING THE ENDOSCOPY:  Eat a soft diet IF YOU HAVE NAUSEA, BLOATING, ABDOMINAL PAIN, OR VOMITING.    FINDING OUT THE RESULTS OF YOUR TEST Not all test results are available during your visit. DR. Darrick Penna WILL CALL YOU WITHIN 7 DAYS OF YOUR PROCEDUE WITH YOUR RESULTS. Do not assume everything is normal if you have not heard from DR. Virginia Francisco IN ONE WEEK, CALL HER OFFICE AT [phone].  SEEK IMMEDIATE MEDICAL ATTENTION AND CALL THE OFFICE: [phone] IF:  You have more than a spotting of blood in your stool.   Your belly is swollen (abdominal distention).   You are nauseated or vomiting.   You have a temperature over 101F.   You have abdominal pain or discomfort that is severe or gets worse throughout the day.   Gastritis  Gastritisis inflammation (the body's way of reacting to injury and/or infection) of the stomach. It is often caused by viral or bacterial (germ) infections. It can also be caused BY ASPIRIN, BC/GOODY POWDER'S, (IBUPROFEN) MOTRIN, OR ALEVE (NAPROXEN), chemicals (including alcohol), SPICY FOODS, and medications. This illness may be associated with generalized malaise (feeling tired, not well), UPPER ABDOMINAL STOMACH cramps, and fever. One common bacterial cause of gastritis is an  organism known as H. Pylori. This can be treated with antibiotics.    High-Fiber Diet A high-fiber diet changes your normal diet to include more whole grains, legumes, fruits, and vegetables. Changes in the diet involve replacing refined carbohydrates with unrefined foods. The calorie level of the diet is essentially unchanged. The Dietary Reference Intake (recommended amount) for adult males is 38 grams per day. For adult females, it is 25 grams per day. Pregnant and lactating women should consume 28 grams of  fiber per day. Fiber is the intact part of a plant that is not broken down during digestion. Functional fiber is fiber that has been isolated from the plant to provide a beneficial effect in the body.  PURPOSE  Increase stool bulk.   Ease and regulate bowel movements.   Lower cholesterol.   REDUCE RISK OF COLON CANCER  INDICATIONS THAT YOU NEED MORE FIBER  Constipation and hemorrhoids.   Uncomplicated diverticulosis (intestine condition) and irritable bowel syndrome.   Weight management.   As a protective measure against hardening of the arteries (atherosclerosis), diabetes, and cancer.   GUIDELINES FOR INCREASING FIBER IN THE DIET  Start adding fiber to the diet slowly. A gradual increase of about 5 more grams (2 slices of whole-wheat bread, 2 servings of most fruits or vegetables, or 1 bowl of high-fiber cereal) per day is best. Too rapid an increase in fiber may result in constipation, flatulence, and bloating.   Drink enough water and fluids to keep your urine clear or pale yellow. Water, juice, or caffeine-free drinks are recommended. Not drinking enough fluid may cause constipation.   Eat a variety of high-fiber foods rather than one type of fiber.   Try to increase your intake of fiber through using high-fiber foods rather than fiber pills or supplements that contain small amounts of fiber.   The goal is to change the types of food eaten. Do not supplement your present diet with high-fiber foods, but replace foods in your present diet.   INCLUDE A VARIETY OF FIBER SOURCES  Replace refined and processed grains with whole grains, canned fruits with fresh fruits, and incorporate other fiber sources. White rice, white breads, and most bakery goods contain little or no fiber.   Brown whole-grain rice, buckwheat oats, and many fruits and vegetables are all good sources of fiber. These include: broccoli, Brussels sprouts, cabbage, cauliflower, beets, sweet potatoes, white  potatoes (skin on), carrots, tomatoes, eggplant, squash, berries, fresh fruits, and dried fruits.   Cereals appear to be the richest source of fiber. Cereal fiber is found in whole grains and bran. Bran is the fiber-rich outer coat of cereal grain, which is largely removed in refining. In whole-grain cereals, the bran remains. In breakfast cereals, the largest amount of fiber is found in those with "bran" in their names. The fiber content is sometimes indicated on the label.   You may need to include additional fruits and vegetables each day.   In baking, for 1 cup white flour, you may use the following substitutions:   1 cup whole-wheat flour minus 2 tablespoons.   1/2 cup white flour plus 1/2 cup whole-wheat flour.   Low-Fat Diet BREADS, CEREALS, PASTA, RICE, DRIED PEAS, AND BEANS These products are high in carbohydrates and most are low in fat. Therefore, they can be increased in the diet as substitutes for fatty foods. They too, however, contain calories and should not be eaten in excess. Cereals can be eaten for snacks as well as for breakfast.  Include foods that contain fiber (fruits, vegetables, whole grains, and legumes). Research shows that fiber may lower blood cholesterol levels, especially the water-soluble fiber found in fruits, vegetables, oat products, and legumes. FRUITS AND VEGETABLES It is good to eat fruits and vegetables. Besides being sources of fiber, both are rich in vitamins and some minerals. They help you get the daily allowances of these nutrients. Fruits and vegetables can be used for snacks and desserts. MEATS Limit lean meat, chicken, [place], and fish to no more than 6 ounces per day. Beef, Pork, and Lamb Use lean cuts of beef, pork, and lamb. Lean cuts include:  Extra-lean ground beef.  Arm roast.  Sirloin tip.  Center-cut ham.  Round steak.  Loin chops.  Rump roast.  Tenderloin.  Trim all fat off the outside of meats before cooking. It is not necessary  to severely decrease the intake of red meat, but lean choices should be made. Lean meat is rich in protein and contains a highly absorbable form of iron. Premenopausal women, in particular, should avoid reducing lean red meat because this could increase the risk for low red blood cells (iron-deficiency anemia). The organ meats, such as liver, sweetbreads, kidneys, and brain are very rich in cholesterol. They should be limited. Chicken and [place] These are good sources of protein. The fat of poultry can be reduced by removing the skin and underlying fat layers before cooking. Chicken and [place] can be substituted for lean red meat in the diet. Poultry should not be fried or covered with high-fat sauces. Fish and Shellfish Fish is a good source of protein. Shellfish contain cholesterol, but they usually are low in saturated fatty acids. The preparation of fish is important. Like chicken and [place], they should not be fried or covered with high-fat sauces. EGGS Egg whites contain no fat or cholesterol. They can be eaten often. Try 1 to 2 egg whites instead of whole eggs in recipes or use egg substitutes that do not contain yolk. MILK AND DAIRY PRODUCTS Use skim or 1% milk instead of 2% or whole milk. Decrease whole milk, natural, and processed cheeses. Use nonfat or low-fat (2%) cottage cheese or low-fat cheeses made from vegetable oils. Choose nonfat or low-fat (1 to 2%) yogurt. Experiment with evaporated skim milk in recipes that call for heavy cream. Substitute low-fat yogurt or low-fat cottage cheese for sour cream in dips and salad dressings. Have at least 2 servings of low-fat dairy products, such as 2 glasses of skim (or 1%) milk each day to help get your daily calcium intake.  FATS AND OILS Reduce the total intake of fats, especially saturated fat. Butterfat, lard, and beef fats are high in saturated fat and cholesterol. These should be avoided as much as possible. Vegetable fats do not contain  cholesterol, but certain vegetable fats, such as coconut oil, palm oil, and palm kernel oil are very high in saturated fats. These should be limited. These fats are often used in bakery goods, processed foods, popcorn, oils, and nondairy creamers. Vegetable shortenings and some peanut butters contain hydrogenated oils, which are also saturated fats. Read the labels on these foods and check for saturated vegetable oils. Unsaturated vegetable oils and fats do not raise blood cholesterol. However, they should be limited because they are fats and are high in calories. Total fat should still be limited to 30% of your daily caloric intake. Desirable liquid vegetable oils are corn oil, cottonseed oil, olive oil, canola oil, safflower oil, soybean oil, and  sunflower oil. Peanut oil is not as good, but small amounts are acceptable. Buy a heart-healthy tub margarine that has no partially hydrogenated oils in the ingredients. Mayonnaise and salad dressings often are made from unsaturated fats, but they should also be limited because of their high calorie and fat content. Seeds, nuts, peanut butter, olives, and avocados are high in fat, but the fat is mainly the unsaturated type. These foods should be limited mainly to avoid excess calories and fat. OTHER EATING TIPS Snacks  Most sweets should be limited as snacks. They tend to be rich in calories and fats, and their caloric content outweighs their nutritional value. Some good choices in snacks are graham crackers, melba toast, soda crackers, bagels (no egg), English muffins, fruits, and vegetables. These snacks are preferable to snack crackers, [place] fries, and chips. Popcorn should be air-popped or cooked in small amounts of liquid vegetable oil. Desserts Eat fruit, low-fat yogurt, and fruit ices. AVOID pastries, cake, and cookies. Sherbet, angel food cake, gelatin dessert, frozen low-fat yogurt, or other frozen products that do not contain saturated fat (pure fruit  juice bars, frozen ice pops) are also acceptable.  COOKING METHODS Choose those methods that use little or no fat. They include: Poaching.  Braising.  Steaming.  Grilling.  Baking.  Stir-frying.  Broiling.  Microwaving.  Foods can be cooked in a nonstick pan without added fat, or use a nonfat cooking spray in regular cookware. Limit fried foods and avoid frying in saturated fat. Add moisture to lean meats by using water, broth, cooking wines, and other nonfat or low-fat sauces along with the cooking methods mentioned above. Soups and stews should be chilled after cooking. The fat that forms on top after a few hours in the refrigerator should be skimmed off. When preparing meals, avoid using excess salt. Salt can contribute to raising blood pressure in some people. EATING AWAY FROM HOME Order entres, potatoes, and vegetables without sauces or butter. When meat exceeds the size of a deck of cards (3 to 4 ounces), the rest can be taken home for another meal. Choose vegetable or fruit salads and ask for low-calorie salad dressings to be served on the side. Use dressings sparingly. Limit high-fat toppings, such as bacon, crumbled eggs, cheese, sunflower seeds, and olives. Ask for heart-healthy tub margarine instead of butter.   Diverticulosis Diverticulosis is a common condition that develops when small pouches (diverticula) form in the wall of the colon. The risk of diverticulosis increases with age. It happens more often in people who eat a low-fiber diet. Most individuals with diverticulosis have no symptoms. Those individuals with symptoms usually experience belly (abdominal) pain, constipation, or loose stools (diarrhea).  HOME CARE INSTRUCTIONS  Increase the amount of fiber in your diet as directed by your caregiver or dietician. This may reduce symptoms of diverticulosis.   Drink at least 6 to 8 glasses of water each day to prevent constipation.   Try not to strain when you have a bowel  movement.   Avoiding nuts and seeds to prevent complications is NOT NECESSARY.   FOODS HAVING HIGH FIBER CONTENT INCLUDE:  Fruits. Apple, peach, pear, tangerine, raisins, prunes.   Vegetables. Brussels sprouts, asparagus, broccoli, cabbage, carrot, cauliflower, romaine lettuce, spinach, summer squash, tomato, winter squash, zucchini.   Starchy Vegetables. Baked beans, kidney beans, lima beans, split peas, lentils, potatoes (with skin).   Grains. Whole wheat bread, brown rice, bran flake cereal, plain oatmeal, white rice, shredded wheat, bran muffins.   Polyps,  Colon  A polyp is extra tissue that grows inside your body. Colon polyps grow in the large intestine. The large intestine, also called the colon, is part of your digestive system. It is a long, hollow tube at the end of your digestive tract where your body makes and stores stool. Most polyps are not dangerous. They are benign. This means they are not cancerous. But over time, some types of polyps can turn into cancer. Polyps that are smaller than a pea are usually not harmful. But larger polyps could someday become or may already be cancerous. To be safe, doctors remove all polyps and test them.    PREVENTION There is not one sure way to prevent polyps. You might be able to lower your risk of getting them if you:  Eat more fruits and vegetables and less fatty food.   Do not smoke.   Avoid alcohol.   Exercise every day.   Lose weight if you are overweight.   Eating more calcium and folate can also lower your risk of getting polyps. Some foods that are rich in calcium are milk, cheese, and broccoli. Some foods that are rich in folate are chickpeas, kidney beans, and spinach.

## 2017-12-20 NOTE — Transfer of Care (Signed)
Immediate Anesthesia Transfer of Care Note  Patient: Emily Grant  Procedure(s) Performed: COLONOSCOPY WITH PROPOFOL (N/A ) ESOPHAGOGASTRODUODENOSCOPY (EGD) WITH PROPOFOL (N/A ) POLYPECTOMY BIOPSY  Patient Location: PACU  Anesthesia Type:MAC  Level of Consciousness: awake, alert  and oriented  Airway & Oxygen Therapy: Patient Spontanous Breathing  Post-op Assessment: Report given to RN  Post vital signs: Reviewed and stable  Last Vitals:  Vitals Value Taken Time  BP 135/124 12/20/2017  9:41 AM  Temp 36.7 C 12/20/2017  9:41 AM  Pulse 97 12/20/2017  9:41 AM  Resp 19 12/20/2017  9:41 AM  SpO2 77 % 12/20/2017  9:41 AM  Vitals shown include unvalidated device data.  Last Pain:  Vitals:   12/20/17 0843  TempSrc:   PainSc: 0-No pain      Patients Stated Pain Goal: 7 (34/19/62 2297)  Complications: No apparent anesthesia complications

## 2017-12-20 NOTE — H&P (Signed)
Primary Care Physician:  Timmothy Euler, MD Primary Gastroenterologist:  Dr. Oneida Alar  Pre-Procedure History & Physical: HPI:  Emily Grant is a 42 y.o. female here for SCREENING FOR COLON/DYSPEPSIA.  Past Medical History:  Diagnosis Date  . Anxiety   . GERD (gastroesophageal reflux disease)   . Herpes genitalis in women    AGE 44  . IBS (irritable bowel syndrome)   . Panic attacks     Past Surgical History:  Procedure Laterality Date  . ADENOIDECTOMY     AS A CHILD  . [facility], 2001   X2  . COLONOSCOPY  2010 [facility]   DEMASON: NO POLYPS    Prior to Admission medications   Medication Sig Start Date End Date Taking? Authorizing Provider  LORazepam (ATIVAN) 0.5 MG tablet Take 1 tablet (0.5 mg total) by mouth 2 (two) times daily as needed for anxiety. 08/16/17  Yes Martin, Mary-Margaret, FNP  Na Sulfate-K Sulfate-Mg Sulf (SUPREP BOWEL PREP KIT) 17.5-3.13-1.6 GM/177ML SOLN Take 1 kit by mouth as directed. 10/20/17  Yes Jeni Duling, Marga Melnick, MD  valACYclovir (VALTREX) 1000 MG tablet Take 1 tablet (1,000 mg total) by mouth daily. 08/16/17  Yes Hassell Done, Mary-Margaret, FNP  pantoprazole (PROTONIX) 40 MG tablet 1 po 30 mins prior to first meal 10/20/17   Danie Binder, MD    Allergies as of 10/20/2017  . (No Known Allergies)    Family History  Problem Relation Age of Onset  . Stomach cancer Maternal Grandfather   . Colon cancer Neg Hx   . Colon polyps Neg Hx     Social History   Socioeconomic History  . Marital status: Married    Spouse name: Not on file  . Number of children: Not on file  . Years of education: Not on file  . Highest education level: Not on file  Occupational History  . Not on file  Social Needs  . Financial resource strain: Not on file  . Food insecurity:    Worry: Not on file    Inability: Not on file  . Transportation needs:    Medical: Not on file    Non-medical: Not on file  Tobacco Use  . Smoking status: Current Every Day  Smoker    Packs/day: 0.50    Types: Cigarettes  . Smokeless tobacco: Never Used  Substance and Sexual Activity  . Alcohol use: Yes    Alcohol/week: 0.0 oz    Comment: MAY BE EVERY KYH:CWCBJ LIGHT, RARE LIQUOR  . Drug use: Yes    Types: Marijuana    Comment: AS OFTEN AS SHE CAN  . Sexual activity: Yes    Partners: Male    Birth control/protection: Surgical    Comment: MARRIED  Lifestyle  . Physical activity:    Days per week: Not on file    Minutes per session: Not on file  . Stress: Not on file  Relationships  . Social connections:    Talks on phone: Not on file    Gets together: Not on file    Attends religious service: Not on file    Active member of club or organization: Not on file    Attends meetings of clubs or organizations: Not on file    Relationship status: Not on file  . Intimate partner violence:    Fear of current or ex partner: Not on file    Emotionally abused: Not on file    Physically abused: Not on file    Forced  sexual activity: Not on file  Other Topics Concern  . Not on file  Social History Narrative   KIDS: AGE 64 AND 19(QUIT SCHOOL).   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    Review of Systems: See HPI, otherwise negative ROS   Physical Exam: BP (!) 147/83   Pulse 70   Temp 98.2 F (36.8 C) (Oral)   Resp (!) 22   LMP 12/20/2017 (Exact Date)   SpO2 99%  General:   Alert,  pleasant and cooperative in NAD Head:  Normocephalic and atraumatic. Neck:  Supple; Lungs:  Clear throughout to auscultation.    Heart:  Regular rate and rhythm. Abdomen:  Soft, nontender and nondistended. Normal bowel sounds, without guarding, and without rebound.   Neurologic:  Alert and  oriented x4;  grossly normal neurologically.  Impression/Plan:     SCREENING FOR COLON/DYSPEPSIA  PLAN:  1.EGD/TCS TODAY. DISCUSSED PROCEDURE, BENEFITS, & RISKS: < 1% chance of medication reaction, bleeding, perforation, or rupture of spleen/liver.

## 2017-12-23 ENCOUNTER — Encounter (HOSPITAL_COMMUNITY): Payer: Self-pay | Admitting: Gastroenterology

## 2018-01-02 ENCOUNTER — Telehealth: Payer: Self-pay | Admitting: Gastroenterology

## 2018-01-02 NOTE — Telephone Encounter (Signed)
Pt had procedure on 12/20/2017 and was checking to see if her results were back yet. [phone]

## 2018-01-02 NOTE — Telephone Encounter (Signed)
Forwarding to Dr. Fields for results.  

## 2018-01-05 NOTE — Progress Notes (Signed)
LMOM to call.

## 2018-01-05 NOTE — Telephone Encounter (Signed)
SE TC JUL 18.

## 2018-01-06 NOTE — Progress Notes (Signed)
LMOM to call.

## 2018-01-09 NOTE — Progress Notes (Signed)
PT is aware.

## 2018-03-17 ENCOUNTER — Other Ambulatory Visit: Payer: Self-pay | Admitting: Nurse Practitioner

## 2018-03-17 DIAGNOSIS — F419 Anxiety disorder, unspecified: Secondary | ICD-10-CM

## 2018-03-21 ENCOUNTER — Ambulatory Visit: Payer: Medicaid Other | Admitting: Gastroenterology

## 2018-10-06 ENCOUNTER — Other Ambulatory Visit: Payer: Self-pay | Admitting: *Deleted

## 2018-10-06 DIAGNOSIS — F419 Anxiety disorder, unspecified: Secondary | ICD-10-CM

## 2019-02-12 ENCOUNTER — Encounter: Payer: Self-pay | Admitting: Nurse Practitioner

## 2019-02-12 ENCOUNTER — Other Ambulatory Visit: Payer: Self-pay

## 2019-02-12 ENCOUNTER — Ambulatory Visit (INDEPENDENT_AMBULATORY_CARE_PROVIDER_SITE_OTHER): Payer: Self-pay | Admitting: Nurse Practitioner

## 2019-02-12 DIAGNOSIS — B3731 Acute candidiasis of vulva and vagina: Secondary | ICD-10-CM

## 2019-02-12 DIAGNOSIS — F410 Panic disorder [episodic paroxysmal anxiety] without agoraphobia: Secondary | ICD-10-CM

## 2019-02-12 DIAGNOSIS — B373 Candidiasis of vulva and vagina: Secondary | ICD-10-CM

## 2019-02-12 MED ORDER — CITALOPRAM HYDROBROMIDE 20 MG PO TABS: 20.0000 mg | ORAL_TABLET | Freq: Every day | ORAL | 5 refills | Status: DC

## 2019-02-12 MED ORDER — FLUCONAZOLE 150 MG PO TABS: 150.0000 mg | ORAL_TABLET | Freq: Once | ORAL | 0 refills | Status: AC

## 2019-02-12 NOTE — Progress Notes (Signed)
Virtual Visit via telephone Note Due to COVID-19 pandemic this visit was conducted virtually. This visit type was conducted due to national recommendations for restrictions regarding the COVID-19 Pandemic (e.g. social distancing, sheltering in place) in an effort to limit this patient's exposure and mitigate transmission in our community. All issues noted in this document were discussed and addressed.  A physical exam was not performed with this format.  I connected with Emily Grant on 02/12/19 at 10:00 by telephone and verified that I am speaking with the correct person using two identifiers. Emily Grant is currently located at home and no one is currently with her during visit. The provider, Mary-Margaret Hassell Done, FNP is located in their office at time of visit.  I discussed the limitations, risks, security and privacy concerns of performing an evaluation and management service by telephone and the availability of in person appointments. I also discussed with the patient that there may be a patient responsible charge related to this service. The patient expressed understanding and agreed to proceed.   History and Present Illness:  Patient calls in today with 2 problems: - felt like she was having and anxiety attack on Saturday. Said that she became SOB and called 911. O2 sat was 100% and her blood pressure and EKG was normal. She has had these episodes in the past and use to be on xanax. She has not taken xanax in over 4 months.  -vaginal itching that started 2 weeks ago. Used some  Monistat OTC and it helped some.     Review of Systems  Constitutional: Negative for diaphoresis and weight loss.  Eyes: Negative for blurred vision, double vision and pain.  Respiratory: Negative for shortness of breath.   Cardiovascular: Negative for chest pain, palpitations, orthopnea and leg swelling.  Gastrointestinal: Negative for abdominal pain.  Skin: Negative for rash.  Neurological:  Negative for dizziness, sensory change, loss of consciousness, weakness and headaches.  Endo/Heme/Allergies: Negative for polydipsia. Does not bruise/bleed easily.  Psychiatric/Behavioral: Negative for memory loss. The patient does not have insomnia.   All other systems reviewed and are negative.    Observations/Objective: Alert and oriented Answers all questions appropriately No distress today  Assessment and Plan: Emily Grant in today with chief complaint of Anxiety and Vaginitis   1. Vaginal candidiasis No bubble baths'void after intercourse - fluconazole (DIFLUCAN) 150 MG tablet; Take 1 tablet (150 mg total) by mouth once for 1 dose.  Dispense: 1 tablet; Refill: 0  2. Panic attack Stress management - citalopram (CELEXA) 20 MG tablet; Take 1 tablet (20 mg total) by mouth daily.  Dispense: 30 tablet; Refill: 5   Follow Up Instructions: 3 months    I discussed the assessment and treatment plan with the patient. The patient was provided an opportunity to ask questions and all were answered. The patient agreed with the plan and demonstrated an understanding of the instructions.   The patient was advised to call back or seek an in-person evaluation if the symptoms worsen or if the condition fails to improve as anticipated.  The above assessment and management plan was discussed with the patient. The patient verbalized understanding of and has agreed to the management plan. Patient is aware to call the clinic if symptoms persist or worsen. Patient is aware when to return to the clinic for a follow-up visit. Patient educated on when it is appropriate to go to the emergency department.   Time call ended:  10:15  I provided 15  minutes of non-face-to-face time during this encounter.    Mary-Margaret Hassell Done, FNP

## 2019-04-10 ENCOUNTER — Encounter: Payer: Self-pay | Admitting: Nurse Practitioner

## 2019-04-10 ENCOUNTER — Ambulatory Visit (INDEPENDENT_AMBULATORY_CARE_PROVIDER_SITE_OTHER): Payer: Self-pay | Admitting: Nurse Practitioner

## 2019-04-10 ENCOUNTER — Other Ambulatory Visit: Payer: Self-pay

## 2019-04-10 DIAGNOSIS — K591 Functional diarrhea: Secondary | ICD-10-CM

## 2019-04-10 NOTE — Progress Notes (Signed)
   Virtual Visit via telephone Note Due to COVID-19 pandemic this visit was conducted virtually. This visit type was conducted due to national recommendations for restrictions regarding the COVID-19 Pandemic (e.g. social distancing, sheltering in place) in an effort to limit this patient's exposure and mitigate transmission in our community. All issues noted in this document were discussed and addressed.  A physical exam was not performed with this format.  I connected with Emily Grant on 04/10/19 at 12:50 by telephone and verified that I am speaking with the correct person using two identifiers. Emily Grant is currently located at home and no one is currently with her during visit. The provider, Mary-Margaret Hassell Done, FNP is located in their office at time of visit.  I discussed the limitations, risks, security and privacy concerns of performing an evaluation and management service by telephone and the availability of in person appointments. I also discussed with the patient that there may be a patient responsible charge related to this service. The patient expressed understanding and agreed to proceed.   History and Present Illness:   Chief Complaint: Diarrhea   HPI Patient woke up with diarrhea this morning. She has history of IBS. She works at [employer] and had to call out of work this morning. Needs doctors note.   Review of Systems  Constitutional: Negative for diaphoresis and weight loss.  Eyes: Negative for blurred vision, double vision and pain.  Respiratory: Negative for shortness of breath.   Cardiovascular: Negative for chest pain, palpitations, orthopnea and leg swelling.  Gastrointestinal: Positive for diarrhea. Negative for constipation, nausea and vomiting.  Skin: Negative for rash.  Neurological: Negative for dizziness, sensory change, loss of consciousness, weakness and headaches.  Endo/Heme/Allergies: Negative for polydipsia. Does not bruise/bleed easily.   Psychiatric/Behavioral: Negative for memory loss. The patient does not have insomnia.   All other systems reviewed and are negative.    Observations/Objective: Alert and oriented- answers all questions appropriately No distress    Assessment and Plan: Emily Grant in today with chief complaint of Diarrhea   1. Functional diarrhea Force fluids Imodium AD as needed   Follow Up Instructions: prn    I discussed the assessment and treatment plan with the patient. The patient was provided an opportunity to ask questions and all were answered. The patient agreed with the plan and demonstrated an understanding of the instructions.   The patient was advised to call back or seek an in-person evaluation if the symptoms worsen or if the condition fails to improve as anticipated.  The above assessment and management plan was discussed with the patient. The patient verbalized understanding of and has agreed to the management plan. Patient is aware to call the clinic if symptoms persist or worsen. Patient is aware when to return to the clinic for a follow-up visit. Patient educated on when it is appropriate to go to the emergency department.   Time call ended:  1:00  I provided 10 minutes of non-face-to-face time during this encounter.    Mary-Margaret Hassell Done, FNP

## 2020-11-10 ENCOUNTER — Ambulatory Visit (INDEPENDENT_AMBULATORY_CARE_PROVIDER_SITE_OTHER): Payer: Medicaid Other | Admitting: Nurse Practitioner

## 2020-11-10 ENCOUNTER — Encounter: Payer: Self-pay | Admitting: Nurse Practitioner

## 2020-11-10 DIAGNOSIS — R059 Cough, unspecified: Secondary | ICD-10-CM | POA: Insufficient documentation

## 2020-11-10 DIAGNOSIS — Z20822 Contact with and (suspected) exposure to covid-19: Secondary | ICD-10-CM | POA: Diagnosis not present

## 2020-11-10 NOTE — Assessment & Plan Note (Addendum)
Patient reports being exposed.  COVID-19.  His son tested positive physical.  His Lynann [employer]olog[employer] and employees will not let her return until a negative COVID-19 test is obtained.  Patient is currently not having any serious symptoms except for a mild cough COVID-19 swab completed results pending.  Education provided to patient on watching out for signs and symptoms of worsening COVID-19 infection, safety practices at home and in public by wearing a mask and washing hands in public.

## 2020-11-10 NOTE — Progress Notes (Signed)
   Virtual Visit  Note Due to COVID-19 pandemic this visit was conducted virtually. This visit type was conducted due to national recommendations for restrictions regarding the COVID-19 Pandemic (e.g. social distancing, sheltering in place) in an effort to limit this patient's exposure and mitigate transmission in our community. All issues noted in this document were discussed and addressed.  A physical exam was not performed with this format.  I connected with Emily Grant on 11/10/20 at  11:30 AM by telephone and verified that I am speaking with the correct person using two identifiers. Emily Grant is currently located at home and spouse is with patient during visit. The provider, Daryll Drown, NP is located in their office at time of visit.  I discussed the limitations, risks, security and privacy concerns of performing an evaluation and management service by telephone and the availability of in person appointments. I also discussed with the patient that there may be a patient responsible charge related to this service. The patient expressed understanding and agreed to proceed.   History and Present Illness:  Cough This is a new problem. The current episode started yesterday. The problem has been unchanged. The problem occurs constantly. The cough is non-productive. Pertinent negatives include no chest pain, chills, ear congestion, ear pain, fever, headaches, heartburn or nasal congestion. Nothing aggravates the symptoms. She has tried nothing for the symptoms.   Patient exposed to COVID-19 from his son 50 to 48 hours ago.   Review of Systems  Constitutional: Negative for chills and fever.  HENT: Negative for ear pain.   Respiratory: Positive for cough.   Cardiovascular: Negative for chest pain.  Gastrointestinal: Negative for heartburn.  Neurological: Negative for headaches.  All other systems reviewed and are negative.    Observations/Objective: Televisit patient  does not sound to be in distress.  Assessment and Plan: Cough Patient reports being exposed.  COVID-19.  His son tested positive physical.  His Lynann [employer] and employees will not let her return until a negative COVID-19 test is obtained.  Patient is currently not having any serious symptoms except for a mild cough COVID-19 swab completed results pending.  Education provided to patient on watching out for signs and symptoms of worsening COVID-19 infection, safety practices at home and in public by wearing a mask and washing hands in public.  Follow Up Instructions: Follow-up with worsening or unresolved symptoms.    I discussed the assessment and treatment plan with the patient. The patient was provided an opportunity to ask questions and all were answered. The patient agreed with the plan and demonstrated an understanding of the instructions.   The patient was advised to call back or seek an in-person evaluation if the symptoms worsen or if the condition fails to improve as anticipated.  The above assessment and management plan was discussed with the patient. The patient verbalized understanding of and has agreed to the management plan. Patient is aware to call the clinic if symptoms persist or worsen. Patient is aware when to return to the clinic for a follow-up visit. Patient educated on when it is appropriate to go to the emergency department.   Time call ended: 11:41 AM  I provided 11 minutes of  non face-to-face time during this encounter.    Daryll Drown, NP

## 2020-11-11 LAB — COVID-19, FLU A+B NAA
Influenza A, NAA: NOT DETECTED
Influenza B, NAA: NOT DETECTED
SARS-CoV-2, NAA: NOT DETECTED

## 2020-11-12 ENCOUNTER — Telehealth: Payer: Self-pay | Admitting: Nurse Practitioner

## 2020-11-12 ENCOUNTER — Encounter: Payer: Self-pay | Admitting: *Deleted

## 2020-11-12 NOTE — Telephone Encounter (Signed)
Patient aware that note and copy of labs are up front to pick up.

## 2022-02-17 ENCOUNTER — Encounter: Payer: Self-pay | Admitting: Nurse Practitioner

## 2022-02-17 ENCOUNTER — Ambulatory Visit: Payer: Self-pay | Admitting: Nurse Practitioner

## 2022-02-17 VITALS — BP 118/73 | HR 84 | Temp 98.6°F | Ht 63.0 in | Wt 164.6 lb

## 2022-02-17 DIAGNOSIS — R052 Subacute cough: Secondary | ICD-10-CM

## 2022-02-17 DIAGNOSIS — J069 Acute upper respiratory infection, unspecified: Secondary | ICD-10-CM

## 2022-02-17 DIAGNOSIS — J029 Acute pharyngitis, unspecified: Secondary | ICD-10-CM

## 2022-02-17 LAB — CULTURE, GROUP A STREP

## 2022-02-17 LAB — RAPID STREP SCREEN (MED CTR MEBANE ONLY): Strep Gp A Ag, IA W/Reflex: NEGATIVE

## 2022-02-17 MED ORDER — PREDNISONE 10 MG (21) PO TBPK: ORAL_TABLET | ORAL | 0 refills | Status: DC

## 2022-02-17 MED ORDER — PSEUDOEPH-BROMPHEN-DM 30-2-10 MG/5ML PO SYRP: 5.0000 mL | ORAL_SOLUTION | Freq: Four times a day (QID) | ORAL | 0 refills | Status: DC | PRN

## 2022-02-17 MED ORDER — AMOXICILLIN-POT CLAVULANATE 875-125 MG PO TABS: 1.0000 | ORAL_TABLET | Freq: Two times a day (BID) | ORAL | 0 refills | Status: DC

## 2022-02-17 NOTE — Patient Instructions (Signed)

## 2022-02-17 NOTE — Progress Notes (Signed)
Acute Office Visit  Subjective:     Patient ID: Emily Grant, female    DOB: 1975-11-28, 46 y.o.   MRN: [id number]  Chief Complaint  Patient presents with   Sore Throat    Sore Throat  This is a new problem. Episode onset: 3-4 days. The problem has been gradually worsening. Neither side of throat is experiencing more pain than the other. There has been no fever. The fever has been present for 3 to 4 days. The pain is severe. Associated symptoms include congestion, coughing and swollen glands. Pertinent negatives include no ear discharge or ear pain. She has had no exposure to strep.  URI  This is a new problem. The current episode started yesterday. The problem has been unchanged. The fever has been present for Less than 1 day. Associated symptoms include congestion, coughing, a sore throat, swollen glands and wheezing. Pertinent negatives include no ear pain or rash. She has tried NSAIDs for the symptoms. The treatment provided no relief.  Cough This is a recurrent problem. The current episode started in the past 7 days. The problem has been unchanged. The problem occurs constantly. The cough is Productive of sputum. Associated symptoms include chills, a sore throat and wheezing. Pertinent negatives include no ear pain, fever, rash or weight loss. Associated symptoms comments: Right lower lobe. Exacerbated by: smoking. She has tried nothing for the symptoms.     Review of Systems  Constitutional:  Positive for chills. Negative for fever and weight loss.  HENT:  Positive for congestion and sore throat. Negative for ear discharge and ear pain.   Respiratory:  Positive for cough and wheezing.   Cardiovascular: Negative.   Genitourinary: Negative.   Skin: Negative.  Negative for itching and rash.  All other systems reviewed and are negative.       Objective:    BP 118/73   Pulse 84   Temp 98.6 F (37 C)   Ht 5\' 3"  (1.6 m)   Wt 164 lb 9.6 oz (74.7 kg)   SpO2 97%   BMI  29.16 kg/m  BP Readings from Last 3 Encounters:  02/17/22 118/73  12/20/17 (!) 155/94  12/13/17 (!) 141/78   Wt Readings from Last 3 Encounters:  02/17/22 164 lb 9.6 oz (74.7 kg)  12/13/17 156 lb (70.8 kg)  10/20/17 171 lb 3.2 oz (77.7 kg)      Physical Exam Vitals and nursing note reviewed.  Constitutional:      Appearance: She is well-developed. She is ill-appearing.  HENT:     Head: Normocephalic.     Right Ear: External ear normal.     Left Ear: External ear normal.     Nose: Congestion present.  Cardiovascular:     Rate and Rhythm: Normal rate and regular rhythm.     Pulses: Normal pulses.     Heart sounds: Normal heart sounds.  Pulmonary:     Effort: Pulmonary effort is normal.     Breath sounds: Normal breath sounds.  Abdominal:     General: Bowel sounds are normal.  Skin:    General: Skin is warm.     Findings: No rash.  Neurological:     General: No focal deficit present.     Mental Status: She is alert and oriented to person, place, and time.  Psychiatric:        Mood and Affect: Mood normal.        Behavior: Behavior normal.     No results  found for any visits on 02/17/22.      Assessment & Plan:  Patient presents with sore throat, cough, swollen lymph nodes, upper respiratory infection, chills and body ache.  Based on patient's symptoms, we will start treating patient with Augmentin, prednisone and Bromfed for cough  Take meds as prescribed - Use a cool mist humidifier  -Use saline nose sprays frequently -Force fluids -For fever or aches or pains- take Tylenol or ibuprofen. -Completed RSV, flu, strep and COVID-19 swab results pending. Follow up with worsening unresolved symptoms    Problem List Items Addressed This Visit       Other   Cough   Relevant Medications   brompheniramine-pseudoephedrine-DM 30-2-10 MG/5ML syrup   Other Visit Diagnoses     Sore throat    -  Primary   Relevant Medications   amoxicillin-clavulanate  (AUGMENTIN) 875-125 MG tablet   Other Relevant Orders   COVID-19, Flu A+B and RSV   Rapid Strep Screen ([facility] ONLY)   Upper respiratory tract infection, unspecified type       Relevant Medications   amoxicillin-clavulanate (AUGMENTIN) 875-125 MG tablet   predniSONE (STERAPRED UNI-PAK 21 TAB) 10 MG (21) TBPK tablet   Other Relevant Orders   COVID-19, Flu A+B and RSV   Rapid Strep Screen ([facility] ONLY)       Meds ordered this encounter  Medications   amoxicillin-clavulanate (AUGMENTIN) 875-125 MG tablet    Sig: Take 1 tablet by mouth 2 (two) times daily.    Dispense:  14 tablet    Refill:  0    Order Specific Question:   Supervising Provider    Answer:   Mechele Claude [[id number]]   predniSONE (STERAPRED UNI-PAK 21 TAB) 10 MG (21) TBPK tablet    Sig: 6 tablets day 1, 5 tablet day 2, 4 tablet day 3, 3 tablet day 4, 2 tablet day 5, 1 tablet day 6    Dispense:  1 each    Refill:  0    Order Specific Question:   Supervising Provider    Answer:   Standley Brooking   brompheniramine-pseudoephedrine-DM 30-2-10 MG/5ML syrup    Sig: Take 5 mLs by mouth 4 (four) times daily as needed.    Dispense:  120 mL    Refill:  0    Order Specific Question:   Supervising Provider    Answer:   Mechele Claude [[id number]]    Return if symptoms worsen or fail to improve.  Daryll Drown, NP

## 2022-02-18 ENCOUNTER — Telehealth: Payer: Self-pay | Admitting: Nurse Practitioner

## 2022-02-18 LAB — COVID-19, FLU A+B AND RSV
Influenza A, NAA: NOT DETECTED
Influenza B, NAA: NOT DETECTED
RSV, NAA: NOT DETECTED
SARS-CoV-2, NAA: NOT DETECTED

## 2022-02-18 NOTE — Telephone Encounter (Signed)
Patient aware strep was the only test back

## 2024-03-26 ENCOUNTER — Ambulatory Visit: Admitting: Family

## 2024-03-26 ENCOUNTER — Encounter: Payer: Self-pay | Admitting: Family

## 2024-03-26 VITALS — BP 161/87 | HR 89 | Temp 98.2°F | Ht 63.0 in | Wt 174.0 lb

## 2024-03-26 DIAGNOSIS — J0100 Acute maxillary sinusitis, unspecified: Secondary | ICD-10-CM

## 2024-03-26 DIAGNOSIS — B009 Herpesviral infection, unspecified: Secondary | ICD-10-CM

## 2024-03-26 DIAGNOSIS — F5101 Primary insomnia: Secondary | ICD-10-CM

## 2024-03-26 DIAGNOSIS — F419 Anxiety disorder, unspecified: Secondary | ICD-10-CM

## 2024-03-26 DIAGNOSIS — R030 Elevated blood-pressure reading, without diagnosis of hypertension: Secondary | ICD-10-CM

## 2024-03-26 MED ORDER — VALACYCLOVIR HCL 1 G PO TABS: 1000.0000 mg | ORAL_TABLET | Freq: Every day | ORAL | 3 refills | Status: AC

## 2024-03-26 MED ORDER — AMOXICILLIN-POT CLAVULANATE 875-125 MG PO TABS: 1.0000 | ORAL_TABLET | Freq: Two times a day (BID) | ORAL | 0 refills | Status: DC

## 2024-03-26 MED ORDER — TRAZODONE HCL 100 MG PO TABS: 50.0000 mg | ORAL_TABLET | Freq: Every day | ORAL | 1 refills | Status: AC

## 2024-03-26 NOTE — Patient Instructions (Signed)

## 2024-03-26 NOTE — Progress Notes (Signed)
 Subjective:    Patient ID: Emily Grant, female    DOB: 1976/01/04, 48 y.o.   MRN: [id number]  Chief Complaint  Patient presents with   Sinus Problem   Insomnia   PT presents to the office today with sinus pressure and congestion that started two weeks ago. She was taking decongestant at home that helps for a little, but as soon as it wears off her pain is 9 out 10.   She is requesting Valtrex  refill. 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 She reports her last flare up was a few months ago.  Sinus Problem This is a new problem. The current episode started 1 to 4 weeks ago. The problem has been gradually worsening since onset. There has been no fever. Her pain is at a severity of 9/10. The pain is moderate. Associated symptoms include congestion, coughing, ear pain, headaches, sinus pressure, sneezing and a sore throat. Pertinent negatives include no shortness of breath. Past treatments include oral decongestants and acetaminophen . The treatment provided mild relief.  Insomnia Primary symptoms: sleep disturbance, difficulty falling asleep.   The current episode started more than one year. The onset quality is gradual. The problem occurs intermittently. The problem has been gradually improving since onset. The treatment provided no relief.      Review of Systems  HENT:  Positive for congestion, ear pain, sinus pressure, sneezing and sore throat.   Respiratory:  Positive for cough. Negative for shortness of breath.   Neurological:  Positive for headaches.  Psychiatric/Behavioral:  Positive for sleep disturbance. The patient has insomnia.   All other systems reviewed and are negative.   Social History   Socioeconomic History   Marital status: Married    Spouse name: Not on file   Number of children: Not on file   Years of education: Not on file   Highest education level: Not on file  Occupational History   Not on file  Tobacco Use   Smoking status: Every Day     Current packs/day: 0.50    Types: Cigarettes   Smokeless tobacco: Never  Substance and Sexual Activity   Alcohol use: Yes    Alcohol/week: 0.0 standard drinks of alcohol    Comment: MAY BE EVERY IJB:RNNMD LIGHT, RARE LIQUOR   Drug use: Yes    Types: Marijuana    Comment: AS OFTEN AS SHE CAN   Sexual activity: Yes    Partners: Male    Birth control/protection: Surgical    Comment: MARRIED  Other Topics Concern   Not on file  Social History Narrative   KIDS: AGE 32 AND 19(QUIT SCHOOL).   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   Social Drivers of Corporate investment banker Strain: Not on file  Food Insecurity: Not on file  Transportation Needs: Not on file  Physical Activity: Not on file  Stress: Not on file  Social Connections: Not on file   Family History  Problem Relation Age of Onset   Stomach cancer Maternal Grandfather    Colon cancer Neg Hx    Colon polyps Neg Hx         Objective:   Physical Exam Vitals reviewed.  Constitutional:      General: She is not in acute distress.    Appearance: She is well-developed.  HENT:     Head: Normocephalic and atraumatic.     Right Ear: Tenderness present. Tympanic membrane is erythematous.     Left Ear: Tympanic membrane  normal.     Mouth/Throat:     Pharynx: Posterior oropharyngeal erythema present.  Eyes:     Pupils: Pupils are equal, round, and reactive to light.  Neck:     Thyroid : No thyromegaly.  Cardiovascular:     Rate and Rhythm: Normal rate and regular rhythm.     Heart sounds: Normal heart sounds. No murmur heard. Pulmonary:     Effort: Pulmonary effort is normal. No respiratory distress.     Breath sounds: Normal breath sounds. No wheezing.  Abdominal:     General: Bowel sounds are normal. There is no distension.     Palpations: Abdomen is soft.     Tenderness: There is no abdominal tenderness.  Musculoskeletal:        General: No tenderness. Normal range of motion.     Cervical back: Normal range of motion and  neck supple.  Skin:    General: Skin is warm and dry.  Neurological:     Mental Status: She is alert and oriented to person, place, and time.     Cranial Nerves: No cranial nerve deficit.     Deep Tendon Reflexes: Reflexes are normal and symmetric.  Psychiatric:        Behavior: Behavior normal.        Thought Content: Thought content normal.        Judgment: Judgment normal.       BP (!) 161/87   Pulse 89   Temp 98.2 F (36.8 C)   Ht 5' 3 (1.6 m)   Wt 174 lb (78.9 kg)   SpO2 95%   BMI 30.82 kg/m      Assessment & Plan:  Emily Grant comes in today with chief complaint of Sinus Problem and Insomnia   Diagnosis and orders addressed:  1. Anxiety  2. HSV-2 infection Restart Valtrex   - valACYclovir  (VALTREX ) 1000 MG tablet; Take 1 tablet (1,000 mg total) by mouth daily.  Dispense: 90 tablet; Refill: 3  3. Acute non-recurrent maxillary sinusitis (Primary) - Take meds as prescribed - Use a cool mist humidifier  -Use saline nose sprays frequently -Force fluids -For any cough or congestion  Use plain Mucinex- regular strength or max strength is fine -For fever or aces or pains- take tylenol  or ibuprofen. -Throat lozenges if help -Follow up if symptoms worsen or do not improve  - amoxicillin -clavulanate (AUGMENTIN ) 875-125 MG tablet; Take 1 tablet by mouth 2 (two) times daily.  Dispense: 14 tablet; Refill: 0  4. Primary insomnia Start trazodone 50-100 mg  Sleep ritual  - traZODone (DESYREL) 100 MG tablet; Take 0.5-1 tablets (50-100 mg total) by mouth at bedtime.  Dispense: 90 tablet; Refill: 1  5. Elevated blood pressure reading Stop decongestant  Force fluids   Return in about 2 weeks (around 04/09/2024), or if symptoms worsen or fail to improve.    Bari Learn, FNP

## 2024-04-06 ENCOUNTER — Ambulatory Visit: Admitting: Family Medicine

## 2024-04-06 ENCOUNTER — Encounter: Payer: Self-pay | Admitting: Family Medicine

## 2024-04-06 VITALS — BP 116/68 | HR 75 | Temp 97.9°F | Ht 63.0 in | Wt 171.0 lb

## 2024-04-06 DIAGNOSIS — B3731 Acute candidiasis of vulva and vagina: Secondary | ICD-10-CM | POA: Diagnosis not present

## 2024-04-06 DIAGNOSIS — B9689 Other specified bacterial agents as the cause of diseases classified elsewhere: Secondary | ICD-10-CM | POA: Diagnosis not present

## 2024-04-06 DIAGNOSIS — N760 Acute vaginitis: Secondary | ICD-10-CM

## 2024-04-06 LAB — WET PREP FOR TRICH, YEAST, CLUE
Trichomonas Exam: NEGATIVE
Yeast Exam: NEGATIVE

## 2024-04-06 MED ORDER — METRONIDAZOLE 500 MG PO TABS: 500.0000 mg | ORAL_TABLET | Freq: Two times a day (BID) | ORAL | 0 refills | Status: AC

## 2024-04-06 MED ORDER — FLUCONAZOLE 150 MG PO TABS: 150.0000 mg | ORAL_TABLET | Freq: Once | ORAL | 0 refills | Status: AC

## 2024-04-06 NOTE — Progress Notes (Signed)
 Subjective: CC:?  Yeast infection PCP: Lavell Bari LABOR, FNP YEP:Fzopwij R Delossantos is a 48 y.o. female presenting to clinic today for:  Vaginal itching with discharge She reports clumpy white discharge for the last 3 to 4 days.  She has had associated itching.  She was recently treated with Augmentin  and has completed that course.  She typically gets these when she takes antibiotics.  Last menstrual cycle was 1 week ago   ROS: Per HPI  No Known Allergies Past Medical History:  Diagnosis Date   Anxiety    GERD (gastroesophageal reflux disease)    Herpes genitalis in women    AGE 32   IBS (irritable bowel syndrome)    Panic attacks     Current Outpatient Medications:    traZODone (DESYREL) 100 MG tablet, Take 0.5-1 tablets (50-100 mg total) by mouth at bedtime., Disp: 90 tablet, Rfl: 1   valACYclovir  (VALTREX ) 1000 MG tablet, Take 1 tablet (1,000 mg total) by mouth daily., Disp: 90 tablet, Rfl: 3   citalopram  (CELEXA ) 20 MG tablet, Take 1 tablet (20 mg total) by mouth daily. (Patient not taking: Reported on 04/06/2024), Disp: 30 tablet, Rfl: 5   pantoprazole  (PROTONIX ) 40 MG tablet, 1 po 30 mins prior to first meal (Patient not taking: Reported on 04/06/2024), Disp: 30 tablet, Rfl: 11 Social History   Socioeconomic History   Marital status: Married    Spouse name: Not on file   Number of children: Not on file   Years of education: Not on file   Highest education level: Not on file  Occupational History   Not on file  Tobacco Use   Smoking status: Every Day    Current packs/day: 0.50    Types: Cigarettes   Smokeless tobacco: Never  Substance and Sexual Activity   Alcohol use: Yes    Alcohol/week: 0.0 standard drinks of alcohol    Comment: MAY BE EVERY IJB:RNNMD LIGHT, RARE LIQUOR   Drug use: Yes    Types: Marijuana    Comment: AS OFTEN AS SHE CAN   Sexual activity: Yes    Partners: Male    Birth control/protection: Surgical    Comment: MARRIED  Other Topics  Concern   Not on file  Social History Narrative   KIDS: AGE 32 AND 19(QUIT SCHOOL).   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   Social Drivers of Corporate investment banker Strain: Not on file  Food Insecurity: Not on file  Transportation Needs: Not on file  Physical Activity: Not on file  Stress: Not on file  Social Connections: Not on file  Intimate Partner Violence: Not on file   Family History  Problem Relation Age of Onset   Stomach cancer Maternal Grandfather    Colon cancer Neg Hx    Colon polyps Neg Hx     Objective: Office vital signs reviewed. BP 116/68   Pulse 75   Temp 97.9 F (36.6 C) (Temporal)   Ht 5' 3 (1.6 m)   Wt 171 lb (77.6 kg)   SpO2 97%   BMI 30.29 kg/m   Physical Examination:  General: Awake, alert, well nourished, No acute distress HEENT: sclera white, MMM  Wet prep: Positive for clue cells and bacteria.  Negative for yeast  Assessment/ Plan: 48 y.o. female   Bacterial vaginosis - Plan: metroNIDAZOLE (FLAGYL) 500 MG tablet  Yeast vaginitis - Plan: WET PREP FOR TRICH, YEAST, CLUE, fluconazole  (DIFLUCAN ) 150 MG tablet   Her wet prep is actually  consistent with bacterial vaginosis.  Flagyl sent.  I have given her Diflucan  to have on hand if needed for yeast vaginitis in the setting of multiple antibiotic uses.  Follow-up as needed   Norene CHRISTELLA Fielding, DO [facility] [phone]

## 2024-04-19 ENCOUNTER — Other Ambulatory Visit (HOSPITAL_COMMUNITY)
Admission: RE | Admit: 2024-04-19 | Discharge: 2024-04-19 | Disposition: A | Discharge status: Home / Self Care | Source: Ambulatory Visit | Attending: Family | Admitting: Family

## 2024-04-19 ENCOUNTER — Encounter: Payer: Self-pay | Admitting: Family

## 2024-04-19 ENCOUNTER — Ambulatory Visit: Admitting: Family

## 2024-04-19 ENCOUNTER — Ambulatory Visit (INDEPENDENT_AMBULATORY_CARE_PROVIDER_SITE_OTHER): Admitting: Family

## 2024-04-19 VITALS — BP 138/81 | HR 91 | Temp 97.6°F | Ht 63.0 in | Wt 172.2 lb

## 2024-04-19 DIAGNOSIS — Z01419 Encounter for gynecological examination (general) (routine) without abnormal findings: Secondary | ICD-10-CM

## 2024-04-19 DIAGNOSIS — B009 Herpesviral infection, unspecified: Secondary | ICD-10-CM

## 2024-04-19 DIAGNOSIS — Z23 Encounter for immunization: Secondary | ICD-10-CM | POA: Diagnosis not present

## 2024-04-19 DIAGNOSIS — Z0001 Encounter for general adult medical examination with abnormal findings: Secondary | ICD-10-CM

## 2024-04-19 DIAGNOSIS — F321 Major depressive disorder, single episode, moderate: Secondary | ICD-10-CM | POA: Diagnosis not present

## 2024-04-19 DIAGNOSIS — Z1211 Encounter for screening for malignant neoplasm of colon: Secondary | ICD-10-CM

## 2024-04-19 DIAGNOSIS — Z1159 Encounter for screening for other viral diseases: Secondary | ICD-10-CM

## 2024-04-19 DIAGNOSIS — Z0000 Encounter for general adult medical examination without abnormal findings: Secondary | ICD-10-CM

## 2024-04-19 DIAGNOSIS — F419 Anxiety disorder, unspecified: Secondary | ICD-10-CM

## 2024-04-19 DIAGNOSIS — F410 Panic disorder [episodic paroxysmal anxiety] without agoraphobia: Secondary | ICD-10-CM | POA: Diagnosis not present

## 2024-04-19 DIAGNOSIS — K219 Gastro-esophageal reflux disease without esophagitis: Secondary | ICD-10-CM | POA: Diagnosis not present

## 2024-04-19 DIAGNOSIS — Z114 Encounter for screening for human immunodeficiency virus [HIV]: Secondary | ICD-10-CM

## 2024-04-19 MED ORDER — OMEPRAZOLE 20 MG PO CPDR: 20.0000 mg | DELAYED_RELEASE_CAPSULE | Freq: Every day | ORAL | 3 refills | Status: AC

## 2024-04-19 MED ORDER — CITALOPRAM HYDROBROMIDE 20 MG PO TABS: 20.0000 mg | ORAL_TABLET | Freq: Every day | ORAL | 2 refills | Status: AC

## 2024-04-19 NOTE — Patient Instructions (Signed)

## 2024-04-19 NOTE — Progress Notes (Signed)
 Subjective:    Patient ID: Emily Grant, female    DOB: 07/17/1975, 48 y.o.   MRN: [id number]  Chief Complaint  Patient presents with   Annual Exam   PT presents to the office today for CPE with pap. She has not taken any medications.  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   Gynecologic Exam The patient's primary symptoms include genital itching and vaginal discharge. The patient's pertinent negatives include no genital odor. The current episode started in the past 7 days. Pertinent negatives include no back pain, chills, discolored urine, dysuria, flank pain, frequency, hematuria, painful intercourse or rash.  Gastroesophageal Reflux She complains of belching and heartburn. This is a chronic problem. The current episode started more than 1 year ago. The problem occurs frequently. The symptoms are aggravated by certain foods. She has tried nothing for the symptoms.  Depression        This is a chronic problem.  The current episode started more than 1 year ago.   The problem occurs intermittently.  Associated symptoms include irritable, restlessness, decreased interest and sad.  Associated symptoms include no helplessness and no hopelessness.  Past treatments include nothing.  Past medical history includes anxiety.   Anxiety Presents for follow-up visit. Symptoms include excessive worry, nervous/anxious behavior and restlessness. Symptoms occur most days. The severity of symptoms is moderate.    Nicotine Dependence Presents for follow-up visit. Her urge triggers include company of smokers. The symptoms have been stable. She smokes 1 pack of cigarettes per day.      Review of Systems  Constitutional:  Negative for chills.  Gastrointestinal:  Positive for heartburn.  Genitourinary:  Positive for vaginal discharge. Negative for dysuria, flank pain, frequency and hematuria.  Musculoskeletal:  Negative for back pain.  Skin:  Negative for rash.  Psychiatric/Behavioral:   Positive for depression. The patient is nervous/anxious.   All other systems reviewed and are negative.   Social History   Socioeconomic History   Marital status: Married    Spouse name: Not on file   Number of children: Not on file   Years of education: Not on file   Highest education level: Not on file  Occupational History   Not on file  Tobacco Use   Smoking status: Every Day    Current packs/day: 0.50    Types: Cigarettes   Smokeless tobacco: Never  Substance and Sexual Activity   Alcohol use: Yes    Alcohol/week: 0.0 standard drinks of alcohol    Comment: MAY BE EVERY IJB:RNNMD LIGHT, RARE LIQUOR   Drug use: Yes    Types: Marijuana    Comment: AS OFTEN AS SHE CAN   Sexual activity: Yes    Partners: Male    Birth control/protection: Surgical    Comment: MARRIED  Other Topics Concern   Not on file  Social History Narrative   KIDS: AGE 46 AND 19(QUIT SCHOOL).   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   Social Drivers of Corporate Investment Banker Strain: Not on file  Food Insecurity: Not on file  Transportation Needs: Not on file  Physical Activity: Not on file  Stress: Not on file  Social Connections: Not on file   Family History  Problem Relation Age of Onset   Stomach cancer Maternal Grandfather    Colon cancer Neg Hx    Colon polyps Neg Hx         Objective:   Physical Exam Vitals reviewed.  Constitutional:  General: She is irritable. She is not in acute distress.    Appearance: She is well-developed.  HENT:     Head: Normocephalic and atraumatic.     Right Ear: Tympanic membrane normal.     Left Ear: Tympanic membrane normal.  Eyes:     Pupils: Pupils are equal, round, and reactive to light.  Neck:     Thyroid : No thyromegaly.  Cardiovascular:     Rate and Rhythm: Normal rate and regular rhythm.     Heart sounds: Normal heart sounds. No murmur heard. Pulmonary:     Effort: Pulmonary effort is normal. No respiratory distress.     Breath sounds:  Normal breath sounds. No wheezing.  Abdominal:     General: Bowel sounds are normal. There is no distension.     Palpations: Abdomen is soft.     Tenderness: There is no abdominal tenderness.  Musculoskeletal:        General: No tenderness. Normal range of motion.     Cervical back: Normal range of motion and neck supple.  Skin:    General: Skin is warm and dry.  Neurological:     Mental Status: She is alert and oriented to person, place, and time.     Cranial Nerves: No cranial nerve deficit.     Deep Tendon Reflexes: Reflexes are normal and symmetric.  Psychiatric:        Behavior: Behavior normal.        Thought Content: Thought content normal.        Judgment: Judgment normal.       BP 138/81   Pulse 91   Temp 97.6 F (36.4 C) (Temporal)   Ht 5' 3 (1.6 m)   Wt 172 lb 3.2 oz (78.1 kg)   BMI 30.50 kg/m      Assessment & Plan:  Emily Grant comes in today with chief complaint of Annual Exam   Diagnosis and orders addressed:  1. Encounter for immunization - Flu vaccine trivalent PF, 6mos and older(Flulaval,Afluria,Fluarix,Fluzone) - CMP14+EGFR  2. Annual physical exam (Primary) - CMP14+EGFR - CBC with Differential/Platelet - Lipid panel - Hepatitis B surface antibody,quantitative - Hepatitis C antibody - HIV Antibody (routine testing w rflx) - Cytology - PAP([place])  3. Gynecologic exam normal - CMP14+EGFR - Cytology - PAP([place])  4. Panic attack - CMP14+EGFR - citalopram  (CELEXA ) 20 MG tablet; Take 1 tablet (20 mg total) by mouth daily.  Dispense: 90 tablet; Refill: 2  5. HSV-2 (herpes simplex virus 2) infection - CMP14+EGFR  6. Gastroesophageal reflux disease without esophagitis - CMP14+EGFR - omeprazole  (PRILOSEC) 20 MG capsule; Take 1 capsule (20 mg total) by mouth daily.  Dispense: 90 capsule; Refill: 3  7. Anxiety - CMP14+EGFR - citalopram  (CELEXA ) 20 MG tablet; Take 1 tablet (20 mg total) by mouth daily.  Dispense: 90  tablet; Refill: 2  8. Depression, major, single episode, moderate (HCC) - CMP14+EGFR - citalopram  (CELEXA ) 20 MG tablet; Take 1 tablet (20 mg total) by mouth daily.  Dispense: 90 tablet; Refill: 2  9. Colon cancer screening - Ambulatory referral to Gastroenterology - CMP14+EGFR  10. Need for hepatitis B screening test - CMP14+EGFR - Hepatitis B surface antibody,quantitative  11. Need for hepatitis C screening test - CMP14+EGFR - Hepatitis C antibody  12. Encounter for screening for HIV - CMP14+EGFR - HIV Antibody (routine testing w rflx)   Labs pending Start Celexa  20 mg   Start Omeprazole  20 mg  Continue current medications  Health Maintenance reviewed Diet and exercise encouraged  Return in about 2 months (around 06/19/2024), or if symptoms worsen or fail to improve, for GAD and Depression.    Bari Learn, FNP

## 2024-04-20 ENCOUNTER — Encounter: Admitting: Family

## 2024-04-23 ENCOUNTER — Encounter (INDEPENDENT_AMBULATORY_CARE_PROVIDER_SITE_OTHER): Payer: Self-pay | Admitting: *Deleted

## 2024-04-23 ENCOUNTER — Ambulatory Visit: Payer: Self-pay | Admitting: Family

## 2024-04-23 LAB — CYTOLOGY - PAP
Adequacy: ABSENT
Diagnosis: NEGATIVE

## 2024-05-09 ENCOUNTER — Ambulatory Visit

## 2024-05-09 ENCOUNTER — Ambulatory Visit: Payer: Self-pay

## 2024-05-09 ENCOUNTER — Encounter: Payer: Self-pay | Admitting: Family Medicine

## 2024-05-09 ENCOUNTER — Ambulatory Visit: Admitting: Family Medicine

## 2024-05-09 VITALS — BP 145/74 | HR 66 | Temp 96.3°F | Ht 63.0 in | Wt 174.8 lb

## 2024-05-09 DIAGNOSIS — M62838 Other muscle spasm: Secondary | ICD-10-CM | POA: Diagnosis not present

## 2024-05-09 DIAGNOSIS — M542 Cervicalgia: Secondary | ICD-10-CM

## 2024-05-09 DIAGNOSIS — M5412 Radiculopathy, cervical region: Secondary | ICD-10-CM | POA: Diagnosis not present

## 2024-05-09 MED ORDER — PREDNISONE 10 MG (21) PO TBPK: ORAL_TABLET | ORAL | 0 refills | Status: AC

## 2024-05-09 MED ORDER — CYCLOBENZAPRINE HCL 10 MG PO TABS: 10.0000 mg | ORAL_TABLET | Freq: Three times a day (TID) | ORAL | 0 refills | Status: AC | PRN

## 2024-05-09 NOTE — Telephone Encounter (Signed)
 Patient being seen today for neck pain.

## 2024-05-09 NOTE — Telephone Encounter (Signed)
 Nurse triage attempted to contact patient, no answer, unable to leave vm, not set up.

## 2024-05-09 NOTE — Telephone Encounter (Signed)
 Copied from CRM #[id number]. Topic: Clinical - Medication Question >> May 09, 2024  4:27 PM [name] wrote: Reason for CRM: Patient states she is unaware of how to take prednisone  - advised instructions listed on back of pack.  Patient has some questions, states she only took 1 earlier today did not realize she should've taken 3 to start off. Wanting to know if it would be okay to take the other 2 or hold off till tomorrow and start over. Please advise # [phone]

## 2024-05-09 NOTE — Telephone Encounter (Signed)
  Nurse triage 2nd attempt to contact patient, no answer, unable to leave vm, not set up.

## 2024-05-09 NOTE — Telephone Encounter (Signed)
 FYI Only or Action Required?: FYI only for provider: appointment scheduled on 05/09/2024.  Patient was last seen in primary care on 04/19/2024 by Lavell Bari LABOR, FNP.  Called Nurse Triage reporting Neck Pain.  Symptoms began x 3 months and  worsening 2 weeks ago.  Interventions attempted: OTC medications: ibuprofen without relief.  Symptoms are: gradually worsening.  Triage Disposition: See HCP Within 4 Hours (Or PCP Triage)  Patient/caregiver understands and will follow disposition?: Yes     Copied from CRM #[id number]. Topic: Clinical - Red Word Triage >> May 09, 2024  8:01 AM Harlene ORN wrote: Red Word that prompted transfer to Nurse Triage: neck pain; can't move her head and make her right hand numb; beleives it is a pinched nerve, is getting worse. Reason for Disposition  [1] SEVERE neck pain (e.g., excruciating, unable to do any normal activities) AND [2] not improved after 2 hours of pain medicine  Answer Assessment - Initial Assessment Questions 1. ONSET: When did the pain begin?      X 3 months and worsening 2. LOCATION: Where does it hurt?      Neck- back/right of neck & tenderness back of right ear 3. PATTERN Does the pain come and go, or has it been constant since it started?      constant 4. SEVERITY: How bad is the pain?  (Scale 0-10; or none or slight stiffness, mild, moderate, severe)     9/10 5. RADIATION: Does the pain go anywhere else, shoot into your arms?     Pain goes down into Right shoulder: numbness & tingling in right hand/fingers   6. CORD SYMPTOMS: Any weakness or numbness of the arms or legs?     no 7. CAUSE: What do you think is causing the neck pain?     unknown 8. NECK OVERUSE: Any recent activities that involved turning or twisting the neck?     no 9. OTHER SYMPTOMS: Do you have any other symptoms? (e.g., headache, fever, chest pain, difficulty breathing, neck swelling)     no 10. PREGNANCY: Is there any chance you are  pregnant? When was your last menstrual period?       Na Ibuprofen without relief  Protocols used: Neck Pain or Stiffness-A-AH

## 2024-05-09 NOTE — Progress Notes (Signed)
 Subjective:  Patient ID: Emily Grant, female    DOB: 03/24/1976, 48 y.o.   MRN: [id number]  Patient Care Team: Lavell Bari LABOR, FNP as PCP - General (Family Medicine) Harvey Margo CROME, MD (Inactive) as Consulting Physician (Gastroenterology)   Chief Complaint:  Neck Pain (X 3 months)   HPI: Emily Grant is a 48 y.o. female presenting on 05/09/2024 for Neck Pain (X 3 months)   Emily Grant is a 48 year old female who presents with right-sided neck pain radiating to the right shoulder and causing numbness in the fingers at times. No loss of function.   She has been experiencing right-sided neck pain for at least two months, radiating to the back of her head, around the back of the ear, and down to her shoulder. The pain is associated with numbness and tingling in three fingers of her hand.  Initially, she thought the pain was due to a muscle spasm and began taking ibuprofen, four 200 mg tablets every four hours, but stopped due to the high number of pills. She then switched to Aleve , taking it every 8 to 12 hours, which initially provided relief but no longer helps.  The pain causes significant discomfort when lying down, leading to sleep disturbances as she is unable to find a comfortable position without pain. She denies any recent injuries and has not used any topical treatments for the pain.      Relevant past medical, surgical, family, and social history reviewed and updated as indicated.  Allergies and medications reviewed and updated. Data reviewed: Chart in Epic.   Past Medical History:  Diagnosis Date   Anxiety    GERD (gastroesophageal reflux disease)    Herpes genitalis in women    AGE 76   IBS (irritable bowel syndrome)    Panic attacks     Past Surgical History:  Procedure Laterality Date   ADENOIDECTOMY     AS A CHILD   BIOPSY  12/20/2017   Procedure: BIOPSY;  Surgeon: Harvey Margo CROME, MD;  Location: [facility];  Service: Endoscopy;;   gastric   CESAREAN SECTION  1999, 2001   X2   COLONOSCOPY  2010 [facility]   DEMASON: NO POLYPS   COLONOSCOPY WITH PROPOFOL  N/A 12/20/2017   Procedure: COLONOSCOPY WITH PROPOFOL ;  Surgeon: Harvey Margo CROME, MD;  Location: [facility];  Service: Endoscopy;  Laterality: N/A;  7:30am-moved to 8:45   ESOPHAGOGASTRODUODENOSCOPY (EGD) WITH PROPOFOL  N/A 12/20/2017   Procedure: ESOPHAGOGASTRODUODENOSCOPY (EGD) WITH PROPOFOL ;  Surgeon: Harvey Margo CROME, MD;  [facility]: [facility];  Service: Endoscopy;  Laterality: N/A;   POLYPECTOMY  12/20/2017   Procedure: POLYPECTOMY;  Surgeon: Harvey Margo CROME, MD;  [facility];  Service: Endoscopy;;  colon    Social History   Socioeconomic History   Marital status: Married    Spouse name: Not on file   Number of children: Not on file   Years of education: Not on file   Highest education level: Not on file  Occupational History   Not on file  Tobacco Use   Smoking status: Every Day    Current packs/day: 0.50    Types: Cigarettes   Smokeless tobacco: Never  Substance and Sexual Activity   Alcohol use: Yes    Alcohol/week: 0.0 standard drinks of alcohol    Comment: MAY BE EVERY IJB:RNNMD LIGHT, RARE LIQUOR   Drug use: Yes    Types: Marijuana    Comment: AS  OFTEN AS SHE CAN   Sexual activity: Yes    Partners: Male    Birth control/protection: Surgical    Comment: MARRIED  Other Topics Concern   Not on file  Social History Narrative   KIDS: AGE 54 AND 19(QUIT SCHOOL).   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   Social Drivers of Corporate Investment Banker Strain: Not on file  Food Insecurity: Not on file  Transportation Needs: Not on file  Physical Activity: Not on file  Stress: Not on file  Social Connections: Not on file  Intimate Partner Violence: Not on file    Outpatient Encounter Medications as of 05/09/2024  Medication Sig   citalopram  (CELEXA ) 20 MG tablet Take 1 tablet (20 mg total) by mouth daily.   cyclobenzaprine  (FLEXERIL ) 10 MG  tablet Take 1 tablet (10 mg total) by mouth 3 (three) times daily as needed for muscle spasms.   omeprazole  (PRILOSEC) 20 MG capsule Take 1 capsule (20 mg total) by mouth daily.   predniSONE  (STERAPRED UNI-PAK 21 TAB) 10 MG (21) TBPK tablet Use as directed on back of pill pack   traZODone  (DESYREL ) 100 MG tablet Take 0.5-1 tablets (50-100 mg total) by mouth at bedtime.   valACYclovir  (VALTREX ) 1000 MG tablet Take 1 tablet (1,000 mg total) by mouth daily.   No facility-administered encounter medications on file as of 05/09/2024.    No Known Allergies  Pertinent ROS per HPI, otherwise unremarkable      Objective:  BP (!) 145/74   Pulse 66   Temp (!) 96.3 F (35.7 C)   Ht 5' 3 (1.6 m)   Wt 174 lb 12.8 oz (79.3 kg)   SpO2 99%   BMI 30.96 kg/m    Wt Readings from Last 3 Encounters:  05/09/24 174 lb 12.8 oz (79.3 kg)  04/19/24 172 lb 3.2 oz (78.1 kg)  04/06/24 171 lb (77.6 kg)    Physical Exam Vitals and nursing note reviewed.  Constitutional:      General: She is not in acute distress.    Appearance: Normal appearance. She is obese. She is not ill-appearing, toxic-appearing or diaphoretic.  HENT:     Head: Normocephalic and atraumatic.     Nose: Nose normal.     Mouth/Throat:     Mouth: Mucous membranes are moist.  Eyes:     Conjunctiva/sclera: Conjunctivae normal.     Pupils: Pupils are equal, round, and reactive to light.  Neck:     Thyroid : No thyroid  mass, thyromegaly or thyroid  tenderness.     Trachea: Trachea and phonation normal.   Cardiovascular:     Rate and Rhythm: Normal rate and regular rhythm.  Pulmonary:     Effort: Pulmonary effort is normal.     Breath sounds: Normal breath sounds.  Musculoskeletal:     Cervical back: Neck supple. Torticollis present. No edema, erythema, signs of trauma, rigidity or crepitus. Pain with movement and muscular tenderness present. No spinous process tenderness. Decreased range of motion.     Right lower leg: No edema.      Left lower leg: No edema.  Skin:    General: Skin is warm and dry.     Capillary Refill: Capillary refill takes less than 2 seconds.  Neurological:     General: No focal deficit present.     Mental Status: She is alert and oriented to person, place, and time.  Psychiatric:        Mood and Affect: Mood normal.  Behavior: Behavior normal.        Thought Content: Thought content normal.        Judgment: Judgment normal.     Results for orders placed or performed in visit on 04/19/24  Cytology - PAP([place])   Collection Time: 04/19/24  2:35 PM  Result Value Ref Range   Adequacy      Satisfactory for evaluation; transformation zone component ABSENT.   Diagnosis      - Negative for intraepithelial lesion or malignancy (NILM)       Pertinent labs & imaging results that were available during my care of the patient were reviewed by me and considered in my medical decision making.  Assessment & Plan:  Asyia was seen today for neck pain.  Diagnoses and all orders for this visit:  Cervical radiculopathy -     cyclobenzaprine (FLEXERIL) 10 MG tablet; Take 1 tablet (10 mg total) by mouth 3 (three) times daily as needed for muscle spasms. -     predniSONE  (STERAPRED UNI-PAK 21 TAB) 10 MG (21) TBPK tablet; Use as directed on back of pill pack  Neck pain -     cyclobenzaprine (FLEXERIL) 10 MG tablet; Take 1 tablet (10 mg total) by mouth 3 (three) times daily as needed for muscle spasms. -     predniSONE  (STERAPRED UNI-PAK 21 TAB) 10 MG (21) TBPK tablet; Use as directed on back of pill pack  Neck muscle spasm -     cyclobenzaprine (FLEXERIL) 10 MG tablet; Take 1 tablet (10 mg total) by mouth 3 (three) times daily as needed for muscle spasms. -     predniSONE  (STERAPRED UNI-PAK 21 TAB) 10 MG (21) TBPK tablet; Use as directed on back of pill pack       Cervical radiculopathy with right-sided neck pain and muscle spasm Chronic right-sided neck pain with associated muscle  spasm and radiculopathy symptoms, including numbness and tingling in the right arm and fingers, persisting for at least two months. No prior evaluation or injury reported. Current management with NSAIDs has been insufficient. Differential includes nerve inflammation or compression. - Prescribed Flexeril as needed for muscle relaxation, cautioning about potential drowsiness. - Prescribed a burst of prednisone  to reduce nerve inflammation. - Advised follow-up with imaging if symptoms do not improve with current treatment. - Recommended obtaining a new pillow to support neck alignment. - Advised use of heat therapy for symptomatic relief. - Instructed to limit ibuprofen use to no more than two tablets if needed.          Continue all other maintenance medications.  Follow up plan: Return if symptoms worsen or fail to improve.   Continue healthy lifestyle choices, including diet (rich in fruits, vegetables, and lean proteins, and low in salt and simple carbohydrates) and exercise (at least 30 minutes of moderate physical activity daily).  Educational handout given for cervical radiculopathy   The above assessment and management plan was discussed with the patient. The patient verbalized understanding of and has agreed to the management plan. Patient is aware to call the clinic if they develop any new symptoms or if symptoms persist or worsen. Patient is aware when to return to the clinic for a follow-up visit. Patient educated on when it is appropriate to go to the emergency department.   Rosaline Bruns, FNP-C [facility] [phone]

## 2024-05-14 ENCOUNTER — Other Ambulatory Visit: Payer: Self-pay | Admitting: Nurse Practitioner

## 2024-05-14 DIAGNOSIS — Z1231 Encounter for screening mammogram for malignant neoplasm of breast: Secondary | ICD-10-CM

## 2024-05-16 ENCOUNTER — Ambulatory Visit
Admission: RE | Admit: 2024-05-16 | Discharge: 2024-05-16 | Disposition: A | Discharge status: Home / Self Care | Payer: Self-pay | Source: Ambulatory Visit | Attending: Family | Admitting: Family

## 2024-05-16 DIAGNOSIS — Z1231 Encounter for screening mammogram for malignant neoplasm of breast: Secondary | ICD-10-CM

## 2024-05-21 NOTE — Telephone Encounter (Signed)
 Patient had mammogram last week. Waiting on results  Emily Gladis, FNP

## 2024-06-12 ENCOUNTER — Ambulatory Visit: Payer: Self-pay | Admitting: Family

## 2024-07-04 ENCOUNTER — Ambulatory Visit: Payer: Self-pay

## 2024-07-04 ENCOUNTER — Encounter: Payer: Self-pay | Admitting: *Deleted

## 2024-07-04 ENCOUNTER — Ambulatory Visit: Admitting: Family Medicine

## 2024-07-04 NOTE — Telephone Encounter (Signed)
 Previous NT encounter disconnected and patient called patient called back. Requesting call back today regarding OV vs VV for work note. Please see NT encounter and respond to patient.  FYI Only or Action Required?: Action required by provider: request for appointment, update on patient condition, and requesting work note.  Patient was last seen in primary care on 05/09/2024 by Severa Rock HERO, FNP.  Called Nurse Triage reporting Numbness and Neck Pain.  Symptoms began several months ago.  Interventions attempted: Rest, hydration, or home remedies.  Symptoms are: gradually worsening.  Triage Disposition: See PCP When Office is Open (Within 3 Days), See Physician Within 24 Hours  Patient/caregiver understands and will follow disposition?: No, wishes to speak with PCP   Please advise, no available OV today. Patient wanted VV. Recommended if sx worsen go to UC. Patient having issues with job requesting work note.              Reason for Disposition  [1] MODERATE pain (e.g., interferes with normal activities) AND [2] present > 3 days  Answer Assessment - Initial Assessment Questions Offered patient appt with PCP tomorrow in person. Patient declined and reports she needs appt today with any provider due to her job needed notification of her pain. Reinforced to patient work note will require in person OV. Patient requested to schedule VV today with DOD for the pain in right hand and requesting call back to request any provider see her today. Patient started new job recently and pain in right hand worsening. Patient on probationary status for new employee and can not miss work or needs to see PCP today per request. Please advise and call back # [phone].        1. ONSET: When did the pain start?     On going since Nov but worsening atleast 1 month ago  2. LOCATION: Where is the pain located?     Right hand , fingers and extends to right neck and shoulder at times. 3.  PAIN: How bad is the pain? (Scale 1-10; or mild, moderate, severe)     6-7/10 4. WORK OR EXERCISE: Has there been any recent work or exercise that involved this part (i.e., hand or wrist) of the body?     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 CAUSE: What do you think is causing the pain?     Not sure but now repetitive motion folding sheets is causing pain 6. AGGRAVATING FACTORS: What makes the pain worse? (e.g., using computer)     Using hand 7. OTHER SYMPTOMS: Do you have any other symptoms? (e.g., fever, neck pain, numbness or tingling, rash, swelling)     Right neck pain, right shoulder pain at times. Right hand , fingers pain numb and swell at times. Can't grip well. Difficulty performing new job. 8. PREGNANCY: Is there any chance you are pregnant? When was your last menstrual period?     na  Protocols used: Hand Pain-A-AH

## 2024-07-04 NOTE — Telephone Encounter (Signed)
 This encounter was created in error - please disregard.

## 2024-07-04 NOTE — Telephone Encounter (Signed)
 Call dropped during triage. Triage complete. Patient needs care advice and appt. Scheduled.  Attempt made to contact patient. LVM for patient to return call to [phone]. Placed in callbacks for additional attempts.   Copied from CRM [id number]. Topic: Clinical - Red Word Triage >> Jul 04, 2024  8:05 AM Montie POUR wrote: Red Word that prompted transfer to Nurse Triage:  She is having pain in her right hand, swollen, numbness in fingers and this is getting worse. Pain level at 6-7 today. She did come in for this on 05/09/24. Her neck and shoulders are starting to hurt again. Reason for Disposition  Numbness in an arm or hand (i.e., loss of sensation)  Answer Assessment - Initial Assessment Questions 1. ONSET: When did the pain begin?     Two weeks 2. LOCATION: Where does it hurt?      Right neck 3. PATTERN Does the pain come and go, or has it been constant since it started?      constant 4. SEVERITY: How bad is the pain?  (Scale 0-10; or none or slight stiffness, mild, moderate, severe)     7/10, greater with activity  5. RADIATION: Does the pain go anywhere else, shoot into your arms?     Right hand 6. CORD SYMPTOMS: Any weakness or numbness of the arms or legs?     Numbness and tingling to right hand and fingers, right thumb swollen 7. CAUSE: What do you think is causing the neck pain?     Recently diagnosed with cervical radiculopathy, aggravated by work  9. OTHER SYMPTOMS: Do you have any other symptoms? (e.g., headache, fever, chest pain, difficulty breathing, neck swelling)     denies  Protocols used: Neck Pain or Stiffness-A-AH

## 2024-07-04 NOTE — Telephone Encounter (Signed)
 Noted. Patient is scheduled to have a video visit with DOD Everlina Rakes) today. Looks like patient reports having these symptoms (neck pain and numbness) for the past several months.
